# Patient Record
Sex: Female | Born: 1980
Health system: Northeastern US, Community
[De-identification: ages and names within clinical notes are randomized; demographics above are authoritative.]

## PROBLEM LIST (undated history)

## (undated) DIAGNOSIS — G40909 Epilepsy, unspecified, not intractable, without status epilepticus: Secondary | ICD-10-CM

## (undated) DIAGNOSIS — N6019 Diffuse cystic mastopathy of unspecified breast: Secondary | ICD-10-CM

## (undated) DIAGNOSIS — R569 Unspecified convulsions: Secondary | ICD-10-CM

## (undated) DIAGNOSIS — M1990 Unspecified osteoarthritis, unspecified site: Secondary | ICD-10-CM

## (undated) DIAGNOSIS — R51 Headache: Secondary | ICD-10-CM

## (undated) HISTORY — DX: Diffuse cystic mastopathy of unspecified breast: N60.19

## (undated) HISTORY — DX: Headache: R51

## (undated) HISTORY — PX: DILATION AND CURETTAGE OF UTERUS: SHX78

---

## 1999-04-21 ENCOUNTER — Encounter: Payer: Self-pay | Admitting: Emergency Medicine

## 1999-04-21 ENCOUNTER — Emergency Department (HOSPITAL_COMMUNITY): Admission: EM | Admit: 1999-04-21 | Discharge: 1999-04-21 | Payer: Self-pay | Admitting: Emergency Medicine

## 2000-03-21 ENCOUNTER — Emergency Department (HOSPITAL_COMMUNITY): Admission: EM | Admit: 2000-03-21 | Discharge: 2000-03-21 | Payer: Self-pay | Admitting: Emergency Medicine

## 2000-08-26 ENCOUNTER — Emergency Department (HOSPITAL_COMMUNITY): Admission: EM | Admit: 2000-08-26 | Discharge: 2000-08-26 | Payer: Self-pay | Admitting: Emergency Medicine

## 2001-02-06 ENCOUNTER — Emergency Department (HOSPITAL_COMMUNITY): Admission: EM | Admit: 2001-02-06 | Discharge: 2001-02-06 | Payer: Self-pay | Admitting: *Deleted

## 2001-02-10 ENCOUNTER — Emergency Department (HOSPITAL_COMMUNITY): Admission: EM | Admit: 2001-02-10 | Discharge: 2001-02-10 | Payer: Self-pay | Admitting: Emergency Medicine

## 2002-05-06 HISTORY — PX: LAPAROSCOPY: SHX197

## 2002-05-06 HISTORY — PX: HYSTEROSCOPY: SHX211

## 2002-08-20 ENCOUNTER — Encounter: Admission: RE | Admit: 2002-08-20 | Discharge: 2002-08-20 | Payer: Self-pay | Admitting: *Deleted

## 2002-08-20 ENCOUNTER — Encounter: Payer: Self-pay | Admitting: *Deleted

## 2002-09-02 ENCOUNTER — Other Ambulatory Visit: Admission: RE | Admit: 2002-09-02 | Discharge: 2002-09-02 | Payer: Self-pay | Admitting: *Deleted

## 2003-07-21 ENCOUNTER — Inpatient Hospital Stay (HOSPITAL_COMMUNITY): Admission: AD | Admit: 2003-07-21 | Discharge: 2003-07-21 | Payer: Self-pay | Admitting: Obstetrics and Gynecology

## 2003-08-23 ENCOUNTER — Encounter: Admission: RE | Admit: 2003-08-23 | Discharge: 2003-08-23 | Payer: Self-pay | Admitting: Obstetrics and Gynecology

## 2003-09-02 ENCOUNTER — Inpatient Hospital Stay (HOSPITAL_COMMUNITY): Admission: RE | Admit: 2003-09-02 | Discharge: 2003-09-06 | Payer: Self-pay | Admitting: Obstetrics and Gynecology

## 2003-09-02 ENCOUNTER — Encounter (INDEPENDENT_AMBULATORY_CARE_PROVIDER_SITE_OTHER): Payer: Self-pay | Admitting: Specialist

## 2003-09-09 ENCOUNTER — Inpatient Hospital Stay (HOSPITAL_COMMUNITY): Admission: AD | Admit: 2003-09-09 | Discharge: 2003-09-09 | Payer: Self-pay | Admitting: Obstetrics and Gynecology

## 2003-09-10 ENCOUNTER — Inpatient Hospital Stay (HOSPITAL_COMMUNITY): Admission: AD | Admit: 2003-09-10 | Discharge: 2003-09-10 | Payer: Self-pay | Admitting: Obstetrics and Gynecology

## 2003-09-12 ENCOUNTER — Inpatient Hospital Stay (HOSPITAL_COMMUNITY): Admission: AD | Admit: 2003-09-12 | Discharge: 2003-09-12 | Payer: Self-pay | Admitting: Obstetrics and Gynecology

## 2004-07-12 ENCOUNTER — Other Ambulatory Visit: Admission: RE | Admit: 2004-07-12 | Discharge: 2004-07-12 | Payer: Self-pay | Admitting: Obstetrics and Gynecology

## 2004-07-21 ENCOUNTER — Ambulatory Visit (HOSPITAL_COMMUNITY): Admission: AD | Admit: 2004-07-21 | Discharge: 2004-07-21 | Payer: Self-pay | Admitting: Obstetrics and Gynecology

## 2004-07-21 ENCOUNTER — Encounter (INDEPENDENT_AMBULATORY_CARE_PROVIDER_SITE_OTHER): Payer: Self-pay | Admitting: *Deleted

## 2005-09-23 ENCOUNTER — Other Ambulatory Visit: Admission: RE | Admit: 2005-09-23 | Discharge: 2005-09-23 | Payer: Self-pay | Admitting: Obstetrics and Gynecology

## 2005-11-25 ENCOUNTER — Inpatient Hospital Stay (HOSPITAL_COMMUNITY): Admission: AD | Admit: 2005-11-25 | Discharge: 2005-11-25 | Payer: Self-pay | Admitting: Obstetrics and Gynecology

## 2005-12-20 ENCOUNTER — Encounter (INDEPENDENT_AMBULATORY_CARE_PROVIDER_SITE_OTHER): Payer: Self-pay | Admitting: Specialist

## 2005-12-20 ENCOUNTER — Inpatient Hospital Stay (HOSPITAL_COMMUNITY): Admission: RE | Admit: 2005-12-20 | Discharge: 2005-12-23 | Payer: Self-pay | Admitting: Obstetrics and Gynecology

## 2005-12-27 ENCOUNTER — Inpatient Hospital Stay (HOSPITAL_COMMUNITY): Admission: AD | Admit: 2005-12-27 | Discharge: 2005-12-28 | Payer: Self-pay | Admitting: Obstetrics and Gynecology

## 2008-06-27 ENCOUNTER — Emergency Department (HOSPITAL_COMMUNITY): Admission: EM | Admit: 2008-06-27 | Discharge: 2008-06-27 | Payer: Self-pay | Admitting: Emergency Medicine

## 2009-04-25 ENCOUNTER — Emergency Department (HOSPITAL_COMMUNITY): Admission: EM | Admit: 2009-04-25 | Discharge: 2009-04-25 | Payer: Self-pay | Admitting: Emergency Medicine

## 2009-07-06 ENCOUNTER — Ambulatory Visit (HOSPITAL_COMMUNITY): Admission: RE | Admit: 2009-07-06 | Discharge: 2009-07-06 | Payer: Self-pay | Admitting: Obstetrics and Gynecology

## 2009-12-15 ENCOUNTER — Encounter: Admission: RE | Admit: 2009-12-15 | Discharge: 2009-12-15 | Payer: Self-pay | Admitting: Obstetrics and Gynecology

## 2010-01-22 ENCOUNTER — Ambulatory Visit (HOSPITAL_COMMUNITY): Admission: RE | Admit: 2010-01-22 | Discharge: 2010-01-22 | Payer: Self-pay | Admitting: Obstetrics and Gynecology

## 2010-02-01 ENCOUNTER — Ambulatory Visit (HOSPITAL_COMMUNITY): Admission: RE | Admit: 2010-02-01 | Discharge: 2010-02-01 | Payer: Self-pay | Admitting: Obstetrics and Gynecology

## 2010-07-19 LAB — CBC
HCT: 35.5 % — ABNORMAL LOW (ref 36.0–46.0)
Hemoglobin: 12.1 g/dL (ref 12.0–15.0)
MCH: 29.1 pg (ref 26.0–34.0)
MCHC: 34.3 g/dL (ref 30.0–36.0)
MCV: 85.2 fL (ref 78.0–100.0)
Platelets: 297 10*3/uL (ref 150–400)
Platelets: 311 10*3/uL (ref 150–400)
RBC: 4.15 MIL/uL (ref 3.87–5.11)
RBC: 4.17 MIL/uL (ref 3.87–5.11)
RDW: 12.8 % (ref 11.5–15.5)
WBC: 8.5 10*3/uL (ref 4.0–10.5)

## 2010-07-29 LAB — CBC
Hemoglobin: 12.5 g/dL (ref 12.0–15.0)
MCHC: 33.5 g/dL (ref 30.0–36.0)
RBC: 4.35 MIL/uL (ref 3.87–5.11)
WBC: 4.6 10*3/uL (ref 4.0–10.5)

## 2010-08-06 LAB — POCT RAPID STREP A (OFFICE): Streptococcus, Group A Screen (Direct): NEGATIVE

## 2010-09-21 NOTE — Op Note (Signed)
NAME:  Paige Dunn, Paige Dunn                 ACCOUNT NO.:  [id number]   MEDICAL RECORD NO.:  [id number]          PATIENT TYPE:  INP   LOCATION:  9107                          FACILITY:  [facility]   PHYSICIAN:  Naima A. Dillard, M.D. DATE OF BIRTH:  05-31-1980   DATE OF PROCEDURE:  12/21/2005  DATE OF DISCHARGE:                                 OPERATIVE REPORT   PREOPERATIVE DIAGNOSES:  1. Intrauterine pregnancy at term.  2. Gestational diabetes.  3. Transverse presentation.  4. For repeat cesarean section.   POSTOPERATIVE DIAGNOSES:  1. Intrauterine pregnancy at term.  2. Gestational diabetes.  3. Transverse presentation.  4. For repeat cesarean section.   PROCEDURE:  Repeat cesarean section and scar revision.   SURGEON:  Naima A. Normand Sloop, M.D.   ASSISTANT:  Elby Showers. Williams, C.N.M.   ANESTHESIA:  Spinal.   SPECIMENS:  Placenta sent to pathology.   ESTIMATED BLOOD LOSS:  700 mL.   IV FLUIDS:  3500 mL.   URINE OUTPUT:  800 mL.   COMPLICATIONS:  None.   FINDINGS:  A female infant born at 33, with a weight of 8 pounds 3 ounces.  Cord pH was 7.29.  Apgars of 3 and 9.  The patient to PACU in stable  condition.   PROCEDURE IN DETAIL:  The patient was taken to the operating room, where  spinal anesthesia was found to be adequate.  She was prepped and draped in  the normal sterile fashion and a Foley catheter was placed.  She was placed  in dorsal supine position with a left lateral tilt.  Once her anesthesia was  found to be adequate, a Pfannenstiel skin incision was made along her  previous incision with a scalpel, carried down to the fascia using Bovie  cautery.  The fascia was incised in the midline, extended bilaterally using  Bovie cautery.  Kochers x2 were placed in the superior aspect of the fascia,  which was dissected off the rectus muscles both sharply and bluntly.  The  inferior aspect of the fascia was dissected off the rectus muscle in a  similar fashion.  Rectus  muscles were separated in the midline and the  peritoneum was identified, tented up and entered sharply.  The bladder blade  was inserted, the vesicouterine peritoneum was identified, tented up and  entered sharply.  A lower transverse incision was made and extended  bilaterally bluntly.  Clear fluid was noted.  The baby was noted to be  transverse, back up and feet down.  I tried to vert the head down to the  pelvis.  That did not work, so then I found both feet and we used breech  maneuvers.  The head was difficult to deliver but after flexing the chin and  flexion and fundal pressure, we were able to deliver the head.  There was a  nuchal x1.  The cord was clamped and cut.  The baby was handed over to the  awaiting pediatricians.  Cord gases were obtained and the placenta was  delivered and sent to pathology.  On inspection of the  uterus, there was no  septum.  The uterus was normal shape and size.  There were no abnormalities.  She had normal tubes and ovaries and normal abdominal anatomy.  The uterus  was cleared of all clot and debris.  The uterus was repaired with 0 Vicryl  on a running locked fashion.  The second layer of 0 Vicryl was used to  imbricate the incision.  Hemostasis was assured.  Irrigation was done.  The  peritoneum was closed with 0 chromic in a running fashion.  The fascia was  closed with 0 Vicryl in a running fashion. The subcutaneous tissue was made  hemostatic using Bovie cautery.  A JP drain was placed in the subcutaneous  space.  The patient's incision had previously opened after her last cesarean  section, so on the left side of the incision the scar was a little bit  hypertrophic and difficult to sew, so part of the scar was cut away and the  incision was then reapproximated using 3-0 Monocryl in a subcuticular  fashion.  Sponge, lap and needle counts were correct.  Patient to recovery  room in stable condition.      Naima A. Normand Sloop, M.D.   Electronically Signed     NAD/MEDQ  D:  12/20/2005  T:  12/21/2005  Job:  [id number]

## 2010-09-21 NOTE — Discharge Summary (Signed)
NAME:  Paige Dunn, Paige Dunn                           ACCOUNT NO.:  [id number]   MEDICAL RECORD NO.:  [id number]                   PATIENT TYPE:  INP   LOCATION:  9125                                 [facility]ACILITY:  [facility]   PHYSICIAN:  Paige A. Dillard, M.D.              DATE OF BIRTH:  07-Jan-1981   DATE OF ADMISSION:  09/02/2003  DATE OF DISCHARGE:  09/06/2003                                 DISCHARGE SUMMARY   ADMISSION DIAGNOSES:  1. Intrauterine pregnancy at term.  2. Breech presentation.  3. Desires cesarean section.   DISCHARGE DIAGNOSES:  1. Intrauterine pregnancy at term.  2. Breech presentation.  3. Desires cesarean section.  4. Status post cesarean section delivery of a female infant in frank breech     presentation, Apgar's were 6 and 8, weight 8 pounds, 4 ounces.  5. Arcuate uterus.   PROCEDURES:  1. Spinal anesthesia.  2. Primary low transverse cesarean section.   HOSPITAL COURSE:  The patient was admitted for an elective primary low  transverse cesarean section for breech presentation secondary to patient's  request.  She was delivered of a female infant in frank breech presentation  with Apgar's of 6 and 8.  The baby was taken to NICU for low blood sugar.  The patient was taken to postpartum for normal postoperative care later that  same day.  The patient was complaining of her legs still being numb and she  was evaluated by Dr. Quillian Quince who found no abnormalities.  On  postoperative day number 2 the baby was transferred to full term nursery and  the patient was doing well. She was breast feeding.  On postoperative day  number 3 she was doing well, she elected to stay and extra day because of  need for increased time with breast feeding during hospitalization.  Her  capillary blood glucose was monitored and found to be 91.  On postoperative  day number 4 she was ready for discharge.  She was experiencing a new  outbreak of HSV-1.  Vital signs were stable, chest clear,  heart - regular  rate and rhythm.  Abdomen was soft and appropriately tender, incision clean, dry and intact.  Lochia was small.  Extremities within normal limits.  She was deemed to have  received full benefit of her hospital stay and was discharged home.   DISCHARGE MEDICATIONS:  1. Motrin 600 mg p.o. q.6h p.r.n.  2. Tylox 1-2 p.o. q.4h p.r.n.  3. Valtrex 500 mg p.o. q.12h x5 days.   DISCHARGE LABS:  White blood count 9.6 thousand, hemoglobin 11.2, platelets  235,000.  RPR nonreactive.  Rubella 5.2.   DISCHARGE INSTRUCTIONS:  Per [facility] hand-out.   FOLLOW UP:  In 6 weeks or p.r.n.   CONDITION ON DISCHARGE:  Stable and well.     Marie L. Williams, C.N.M.  Paige Dunn, M.D.    MLW/MEDQ  D:  09/06/2003  T:  09/07/2003  Job:  [id number]

## 2010-09-21 NOTE — Discharge Summary (Signed)
NAME:  Paige Dunn, Paige Dunn NO.:  [id number]   MEDICAL RECORD NO.:  [id number]          PATIENT TYPE:  INP   LOCATION:  9107                          [facility]   PHYSICIAN:  Crist Fat. Rivard, M.D. DATE OF BIRTH:  1981-04-18   DATE OF ADMISSION:  12/20/2005  DATE OF DISCHARGE:  12/23/2005                                 DISCHARGE SUMMARY   ADMITTING DIAGNOSES:  1. Intrauterine pregnancy at term. Desires repeat cesarean section.  2. Gestational diabetes.   PROCEDURE:  Repeat low transverse cesarean section.   DISCHARGE DIAGNOSIS:  1. Intrauterine pregnancy at term, delivered.  2. Repeat low transverse cesarean section.  3. Gestational diabetes.   [name] is a 30 year old gravida 3, para 1-0-1-1 who presented at 30-3/7  weeks for repeat cesarean section. This was accomplished on December 20, 2005  with Dr. Jaymes Graff as surgeon. The patient delivered an 8 pound 3 ounce  female infant with Apgar scores of 3 at 1 minute and 9 at 5 minutes.  Both  mother and infant have done well in the immediate postoperative period. The  patient's hemoglobin on the first postpartum day was 11.3. Her vital signs  remained stable. She is afebrile.  Her fasting blood sugars have remained  high. They were 122 on the first day and 134 on the second postoperative  day. Dr. Normand Sloop has recommended that patient continue to check her fasting  blood sugars x1 week and report them to the office. Her incision is clean,  dry, and intact on this her third postoperative day. Her JP drain was  removed easily with minimal drainage. The patient is judged to be in  satisfactory condition for discharge. Discharge instructions are provided  per [facility] handout.   DISCHARGE MEDICATIONS:  1. Motrin 600 mg p.o. q.6 h. p.r.n. pain.  2. Tylox 1-2 p.o. q.3-4 h. p.r.n. pain.  3. Micronor. The patient can start in 2 weeks if desired for      contraception.  4. Prenatal vitamins.   DISCHARGE FOLLOWUP:  Discharge follow-up will be at the office of [facility] in  six weeks.     [name], C.N.M.      Crist Fat Rivard, M.D.  Electronically Signed   SDM/MEDQ  D:  12/23/2005  T:  12/23/2005  Job:  [id number]

## 2010-09-21 NOTE — Op Note (Signed)
NAME:  Paige Dunn, Paige Dunn                           ACCOUNT NO.:  [id number]   MEDICAL RECORD NO.:  [id number]                   PATIENT TYPE:  INP   LOCATION:  9198                                 [facility]   PHYSICIAN:  Naima A. Dillard, M.D.              DATE OF BIRTH:  10/08/1980   DATE OF PROCEDURE:  09/02/2003  DATE OF DISCHARGE:                                 OPERATIVE REPORT   PREOPERATIVE DIAGNOSES:  Breech at term, desires cesarean section.   POSTOPERATIVE DIAGNOSES:  Breech at term, desires cesarean section.   PROCEDURE:  Primary low transverse cesarean section.   ANESTHESIA:  Spinal.   SURGEON:  Naima A. Normand Sloop, M.D.   ASSISTANT:  Concha Pyo. Duplantis, C.N.M.   ESTIMATED BLOOD LOSS:  600 mL   IV FLUIDS:  3600 mL crystalloid.   URINE OUTPUT:  700 mL clear urine.   FINDINGS:  A female infant in frank breech presentation with clear fluid,  normal appearing tubes and ovaries bilaterally. Patient with an arcuate  uterus, normal abdominal anatomy.   DESCRIPTION OF PROCEDURE:  The patient was taken to the operating room where  she was given spinal anesthesia, placed in dorsal supine position with a  left lateral tilt and prepped and draped in the normal sterile fashion. A  Pfannenstiel skin incision was then made with the scalpel and carried down  to the fascia using Bovie cautery. The fascia was incised to the midline,  extended bilaterally using Bovie cautery.  Kocher's x2 are placed in the  superior aspect of the fascia which was dissected off the rectus muscles  both sharply and bluntly.  Hemostasis was noted.  The inferior aspect of the  fascia was dissected off the rectus muscle in a similar fashion.  The rectus  muscle was separated in the midline, peritoneum was identified, tented up  and entered sharply and extended bluntly.  The vesicouterine peritoneum was  identified, tented up and entered sharply with the Metzenbaum scissors,  extended bilaterally.   Using Metzenbaum scissors, the bladder flap was  created, bladder blade was inserted.  Primary lower transverse uterine  incision was then made with a scalpel and extended bluntly.  The infant was  delivered using breech maneuvers, both feet were grabbed and infant was  delivered without difficulty and handed over to the pediatrician. Cord gas  and cord blood were both obtained, placenta was delivered manually and noted  to be a little __________ in part of the cord.  The uterus was cleared of  all clot and debris, uterine incision was repaired with #0 Vicryl in a  running locked fashion. A second layer of #0 Vicryl was used for  embrocation. The abdomen was irrigated copiously, the findings noted above  were seen.  Hemostasis was noted. The peritoneum was closed with #0  chromic. The fascia was closed with #0 Vicryl in a running fashion. The  subcutaneous tissue was irrigated, noted to be hemostatic, the skin was  closed with staples.  Sponge, lap and needle counts were correct x2.  Cord  pH was 7.31.  The patient went to the recovery room in stable condition.                                               Naima A. Normand Sloop, M.D.    NAD/MEDQ  D:  09/02/2003  T:  09/02/2003  Job:  [id number]

## 2010-09-21 NOTE — H&P (Signed)
NAME:  Paige Dunn, Paige Dunn                           ACCOUNT NO.:  [id number]   MEDICAL RECORD NO.:  [id number]                   PATIENT TYPE:  INP   LOCATION:  NA                                   [facility]   PHYSICIAN:  Naima A. Dillard, M.D.              DATE OF BIRTH:  [birth year]   DATE OF ADMISSION:  DATE OF DISCHARGE:                                HISTORY & PHYSICAL   CHIEF COMPLAINT:  A 39-week breech presentation, desires Cesarean section.   HISTORY OF PRESENT ILLNESS:  The patient is a 30 year old African American  female, gravida 1, para 0, whose last menstrual period was December 01, 2002,  estimated date of confinement of Sep 07, 2003, consistent with a 17 week  ultrasound, presenting at 37-2/7 weeks for Cesarean section secondary to  breech presentation. The patient had an ultrasound at 36 weeks that  demonstrated the baby to be between 50th and 75th percentile weighing 6  pounds 8 ounces, normal AFI at 10.6, and frank breech presentation. The  patient was given the option of external cephalic version versus Cesarean  section. The risks and benefits of each were reviewed and the patient  decided to proceed with Cesarean section. Pregnancy has been complicated by  gestational diabetes diagnosed at 37 weeks secondary to glucosuria.  The  patient's blood sugars fasting have been from 89 to 119, two hour  postprandial has been 81 to as high as 231. The patient has had NSTs which  have been reactive twice a week. Due to the patient's breech presentation,  she will have a Cesarean section. She is rubella nonimmune, varicella  nonimmune, and will be immune postpartum. Also, group strep positive.   PAST OBSTETRIC HISTORY:  Unremarkable.   PAST GYNECOLOGIC HISTORY:  No history of any sexually transmitted diseases,  menarche at 6 or 14 occurring every 30 days lasting for six to eight days.  The patient denies any history of abnormal Pap smear.   PAST SURGICAL HISTORY:  Significant  for a D&C, hysteroscopy, and laparoscopy  for treatment of endometriosis in July 2004.   The patient is married, has a supportive husband. Denies any alcohol, drug,  or illicit drug use.   PAST MEDICAL HISTORY:  As above.   ALLERGIES AND DRUG SENSITIVITIES:  ASPIRIN and IBUPROFEN; the patient says  it gives her the shakes.   MEDICATIONS:  Prenatal vitamins.   PRENATAL LABORATORY:  O positive, antibody negative. RPR nonreactive.  Rubella nonimmune. Varicella nonimmune. Hepatitis B surface antigen is  negative. HIV is deferred. Quad screen is deferred. Platelets 318,000,  hemoglobin 10. Sickle trait negative. One-hour Glucola at 28 weeks was 113.  Pap test within normal limits in April 2004.  GBS positive.   FAMILY HISTORY:  Father has history of myocardial infarction. Mother has  history of high blood pressure. Maternal grandmother with history of insulin-  dependent diabetes.   PHYSICAL  EXAMINATION:  VITAL SIGNS: The patient weighed 181 pounds. Fetal  heart tones 150, blood pressure 116/72. Urine revealed trace protein,  negative sugar.  SKIN: Within normal limits.  NEUROLOGIC: Within normal limits.  EXTREMITIES: No clubbing, cyanosis, or edema.  HEENT: Head was normocephalic and atraumatic. Mouth revealed normal buccal  mucosa. Nares clear. The patient had good dentition.  NECK: The patient had no thyromegaly.  BREASTS: no nipple retraction. No abnormal masses or axillary masses. No  nipple discharge bilaterally.  HEART: Regular rate and rhythm.  LUNGS: Clear to auscultation bilaterally.  ABDOMEN: Gravid, soft, and nontender, measuring 38 cm.  EXTREMITIES: No clubbing or cyanosis, but trace edema.  PELVIC EXAM: Last cervical exam revealed the cervix long and closed.   ASSESSMENT:  Breech presentation at 39 weeks, desirous of Cesarean section.   The patient understands the risks not limited to bleeding, infection, damage  to internal organs such as bowel, bladder, or major  blood vessels, need for  another Cesarean section in the future, possibilities of placenta previa,  accreta/increta/percreta, uterine rupture with subsequent pregnancies, and  even hysterectomy. The patient agrees to proceed with plan of care.                                               Naima A. Normand Sloop, M.D.    NAD/MEDQ  D:  08/29/2003  T:  08/30/2003  Job:  [id number]

## 2010-09-21 NOTE — Op Note (Signed)
NAME:  Paige Dunn, Paige Dunn NO.:  [id number]   MEDICAL RECORD NO.:  [id number]          PATIENT TYPE:  AMB   LOCATION:  SDC                           [facility]   PHYSICIAN:  Hal Morales, M.D.DATE OF BIRTH:  30-Jul-1980   DATE OF PROCEDURE:  DATE OF DISCHARGE:                                 OPERATIVE REPORT   PREOPERATIVE DIAGNOSIS:  Anembryonic pregnancy and incomplete abortion.   POSTOPERATIVE DIAGNOSIS:  Anembryonic pregnancy and incomplete abortion.   OPERATION:  Suction dilatation and evacuation.   SURGEON:  Dr. Dierdre Forth.   ANESTHESIA:  Monitored anesthesia care and local.   ESTIMATED BLOOD LOSS:  Less than 25 mL.   COMPLICATIONS:  None.   FINDINGS:  The uterus was enlarged to approximately 8-week size.  There was  a large amount of products of conception obtained at time of evacuation.   SPECIMENS TO PATHOLOGY:  Products of conception.   BLOOD TYPE:  O positive.   PREOPERATIVE DISCUSSION:  A discussion was held with the patient concerning  the finding on ultrasound of anembryonic pregnancy. The options of  observation, Cytotec, and dilatation and evacuation were presented to the  patient and she opted for dilatation and evacuation. The risks of  anesthesia, bleeding, infection, damage to adjacent organs, and uterine  perforation were all explained to her and she wished to proceed.   PROCEDURE IN DETAIL:  The patient was taken to the operating room after  appropriate identification and placed on the operating table. After  placement of equipment for monitored anesthesia care, She was placed in the  lithotomy position. The perineum and vagina were prepped with multiple  layers of Betadine and draped as a sterile field. A red Robinson catheter  was used to empty the bladder. A Graves speculum was placed in the vagina  and a single-tooth tenaculum placed on the anterior cervix. A paracervical  block was achieved with a total of 10 mL of  2% Xylocaine and the 5 and 7  o'clock positions. The cervix was dilated to accommodate a #8 suction  curette and this was used to suction evacuate all quadrants of the uterus.  Once all products of conception had been removed, all instruments were  removed from the vagina and the patient taken from the operating room to the  recovery room in satisfactory condition having tolerated the procedure well  with sponge and instrument counts correct.   DISCHARGE INSTRUCTIONS:  Printed instructions from the [facility] for  D&E.   DISCHARGE MEDICATIONS:  1.  Methergine 0.2 mg p.o. q.6h for eight doses.  2.  Doxycycline 100 mg p.o. b.i.d. times 5 days.  3.  Ibuprofen over-the-counter 600 mg p.o. q.6h p.r.n. pain.   FOLLOW-UP:  The patient is to call for 2-week follow-up with Dr. Pennie Rushing.      VPH/MEDQ  D:  07/21/2004  T:  07/22/2004  Job:  [id number]

## 2010-09-21 NOTE — H&P (Signed)
NAME:  Paige Dunn, Paige Dunn                 ACCOUNT NO.:  [id number]   MEDICAL RECORD NO.:  [id number]          PATIENT TYPE:  INP   LOCATION:  NA                            [facility]   PHYSICIAN:  Naima A. Dillard, M.D. DATE OF BIRTH:  08/13/1980   DATE OF ADMISSION:  DATE OF DISCHARGE:                                HISTORY & PHYSICAL   The patient is a 30 year old gravida 3, para 1-0-1-1, who is a 38 weeks 3  days with a transverse lie.  The patient is for repeat cesarean section.   HISTORY AND PHYSICAL:  The patient presented for her prenatal care at 11  weeks and has had the following issues:   1. She had hip pain, which was corrected with ibuprofen and Flexeril.  2. She developed gestational diabetes just at 38 weeks.  Her 18-week      Glucola and 28-week Glucola were normal.  She came in for the 2-hour      postprandial on August 14 with 203 and on August 15 with a fasting      blood sugar of 117.  3. Also the patient has a history of previous cesarean section for breech      but and she desired to VBAC, but because the patient has an abnormal      lie we will proceed with repeat cesarean section.   PERTINENT LABS:  Last hemoglobin was 12, last platelets were 325.  She is O  positive, antibody negative.  Sickle cell is negative.  She is rubella-  immune.  RPR was nonreactive.  GC, chlamydia and GBS were negative.  Patient  deferred MSAFP.  Hepatitis B surface antigen was negative.   PAST OBSTETRICAL HISTORY:  Significant for in April 2005 the patient had a  little boy weighing 8 pounds 4 ounces, and she had a primary low transverse  cesarean section for breech presentation.  In March 2006 she had a D&E for  an anembryonic pregnancy.  A pregnancy is at present.   PAST GYNECOLOGIC HISTORY:  Significant for anembryonic pregnancy.  In July  2004 she had a laparoscopy for endometriosis.  Denies any history of  sexually transmitted diseases or abnormal Pap smear.   PAST MEDICAL  HISTORY:  As above.   The patient has no known drug allergies.   FAMILY HISTORY:  Significant for father with history of a myocardial  infarction and maternal grandmother with chronic hypertension, and the  father of the baby has a nice with Down syndrome.   PHYSICAL EXAMINATION:  VITAL SIGNS:  The patient weighs 195 pounds.  Blood  pressure is 110/64.  Fundal height is 38.  She is 5 feet 10 inches.  SKIN:  Normal.  NEUROLOGIC:  Normal.  EXTREMITIES:  No cyanosis or clubbing but trace edema.  HEENT:  Normal.  NECK:  Thyroid is not enlarged.  BREASTS:  No nipple retraction or abnormal masses.  CARDIAC:  Regular rate and rhythm.  LUNGS:  Clear to auscultation bilaterally.  ABDOMEN:  Gravid, soft and nontender.  PELVIC:  Vulvovaginal exam is within normal  limits.  Cervix is 1, long and  high.   At her last ultrasound the baby was transverse, weighing 7 pounds 15 ounces  with AFI of 19.4.   ASSESSMENT:  1. Pregnancy at term, desires repeat cesarean section.  2. Late-onset gestational diabetes.   PLAN:  Repeat cesarean section.  The patient understands the risks are but  not limited to bleeding, infection, damage to internal organs such as bowel,  bladder and major blood vessels, and also abnormal presentation in future  pregnancies.  The patient was also offered a version.  Risks and benefits  were reviewed.  The patient has desired to go ahead with repeat cesarean  section.      Naima A. Normand Sloop, M.D.  Electronically Signed     NAD/MEDQ  D:  12/19/2005  T:  12/19/2005  Job:  [id number]

## 2010-10-12 ENCOUNTER — Emergency Department (HOSPITAL_COMMUNITY)
Admission: EM | Admit: 2010-10-12 | Discharge: 2010-10-13 | Disposition: A | Discharge status: Home / Self Care | Payer: BC Managed Care – PPO | Attending: Emergency Medicine | Admitting: Emergency Medicine

## 2010-10-12 DIAGNOSIS — X58XXXA Exposure to other specified factors, initial encounter: Secondary | ICD-10-CM | POA: Insufficient documentation

## 2010-10-12 DIAGNOSIS — R079 Chest pain, unspecified: Secondary | ICD-10-CM | POA: Insufficient documentation

## 2010-10-12 DIAGNOSIS — T7840XA Allergy, unspecified, initial encounter: Secondary | ICD-10-CM | POA: Insufficient documentation

## 2010-10-12 DIAGNOSIS — L02419 Cutaneous abscess of limb, unspecified: Secondary | ICD-10-CM | POA: Insufficient documentation

## 2010-10-31 ENCOUNTER — Emergency Department (HOSPITAL_COMMUNITY)
Admission: EM | Admit: 2010-10-31 | Discharge: 2010-10-31 | Disposition: A | Discharge status: Home / Self Care | Payer: BC Managed Care – PPO | Attending: Emergency Medicine | Admitting: Emergency Medicine

## 2010-10-31 DIAGNOSIS — L02419 Cutaneous abscess of limb, unspecified: Secondary | ICD-10-CM | POA: Insufficient documentation

## 2010-11-01 ENCOUNTER — Other Ambulatory Visit: Payer: Self-pay | Admitting: Internal Medicine

## 2010-11-01 DIAGNOSIS — M79669 Pain in unspecified lower leg: Secondary | ICD-10-CM

## 2010-11-02 ENCOUNTER — Ambulatory Visit
Admission: RE | Admit: 2010-11-02 | Discharge: 2010-11-02 | Disposition: A | Discharge status: Home / Self Care | Payer: BC Managed Care – PPO | Source: Ambulatory Visit | Attending: Internal Medicine | Admitting: Internal Medicine

## 2010-11-02 DIAGNOSIS — M79669 Pain in unspecified lower leg: Secondary | ICD-10-CM

## 2011-07-23 ENCOUNTER — Ambulatory Visit: Payer: Self-pay | Admitting: Obstetrics and Gynecology

## 2011-07-27 ENCOUNTER — Emergency Department (INDEPENDENT_AMBULATORY_CARE_PROVIDER_SITE_OTHER): Payer: BC Managed Care – PPO

## 2011-07-27 ENCOUNTER — Encounter (HOSPITAL_BASED_OUTPATIENT_CLINIC_OR_DEPARTMENT_OTHER): Payer: Self-pay | Admitting: *Deleted

## 2011-07-27 ENCOUNTER — Emergency Department (HOSPITAL_BASED_OUTPATIENT_CLINIC_OR_DEPARTMENT_OTHER)
Admission: EM | Admit: 2011-07-27 | Discharge: 2011-07-27 | Disposition: A | Discharge status: Home / Self Care | Payer: BC Managed Care – PPO | Attending: Emergency Medicine | Admitting: Emergency Medicine

## 2011-07-27 DIAGNOSIS — R059 Cough, unspecified: Secondary | ICD-10-CM | POA: Insufficient documentation

## 2011-07-27 DIAGNOSIS — R0989 Other specified symptoms and signs involving the circulatory and respiratory systems: Secondary | ICD-10-CM

## 2011-07-27 DIAGNOSIS — J029 Acute pharyngitis, unspecified: Secondary | ICD-10-CM

## 2011-07-27 DIAGNOSIS — J069 Acute upper respiratory infection, unspecified: Secondary | ICD-10-CM

## 2011-07-27 DIAGNOSIS — R05 Cough: Secondary | ICD-10-CM | POA: Insufficient documentation

## 2011-07-27 DIAGNOSIS — Z886 Allergy status to analgesic agent status: Secondary | ICD-10-CM | POA: Insufficient documentation

## 2011-07-27 DIAGNOSIS — H9209 Otalgia, unspecified ear: Secondary | ICD-10-CM | POA: Insufficient documentation

## 2011-07-27 NOTE — Discharge Instructions (Signed)
Antibiotic Nonuse  Your caregiver felt that the infection or problem was not one that would be helped with an antibiotic. Infections may be caused by viruses or bacteria. Only a caregiver can tell which one of these is the likely cause of an illness. A cold is the most common cause of infection in both adults and children. A cold is a virus. Antibiotic treatment will have no effect on a viral infection. Viruses can lead to many lost days of work caring for sick children and many missed days of school. Children may catch as many as 10 "colds" or "flus" per year during which they can be tearful, cranky, and uncomfortable. The goal of treating a virus is aimed at keeping the ill person comfortable. Antibiotics are medications used to help the body fight bacterial infections. There are relatively few types of bacteria that cause infections but there are hundreds of viruses. While both viruses and bacteria cause infection they are very different types of germs. A viral infection will typically go away by itself within 7 to 10 days. Bacterial infections may spread or get worse without antibiotic treatment. Examples of bacterial infections are:  Sore throats (like strep throat or tonsillitis).   Infection in the lung (pneumonia).   Ear and skin infections.  Examples of viral infections are:  Colds or flus.   Most coughs and bronchitis.   Sore throats not caused by Strep.   Runny noses.  It is often best not to take an antibiotic when a viral infection is the cause of the problem. Antibiotics can kill off the helpful bacteria that we have inside our body and allow harmful bacteria to start growing. Antibiotics can cause side effects such as allergies, nausea, and diarrhea without helping to improve the symptoms of the viral infection. Additionally, repeated uses of antibiotics can cause bacteria inside of our body to become resistant. That resistance can be passed onto harmful bacterial. The next time  you have an infection it may be harder to treat if antibiotics are used when they are not needed. Not treating with antibiotics allows our own immune system to develop and take care of infections more efficiently. Also, antibiotics will work better for us when they are prescribed for bacterial infections. Treatments for a child that is ill may include:  Give extra fluids throughout the day to stay hydrated.   Get plenty of rest.   Only give your child over-the-counter or prescription medicines for pain, discomfort, or fever as directed by your caregiver.   The use of a cool mist humidifier may help stuffy noses.   Cold medications if suggested by your caregiver.  Your caregiver may decide to start you on an antibiotic if:  The problem you were seen for today continues for a longer length of time than expected.   You develop a secondary bacterial infection.  SEEK MEDICAL CARE IF:  Fever lasts longer than 5 days.   Symptoms continue to get worse after 5 to 7 days or become severe.   Difficulty in breathing develops.   Signs of dehydration develop (poor drinking, rare urinating, dark colored urine).   Changes in behavior or worsening tiredness (listlessness or lethargy).  Document Released: 07/01/2001 Document Revised: 04/11/2011 Document Reviewed: 12/28/2008 ExitCare Patient Information 2012 [employer].Viral Syndrome You or your child has Viral Syndrome. It is the most common infection causing "colds" and infections in the nose, throat, sinuses, and breathing tubes. Sometimes the infection causes nausea, vomiting, or diarrhea. The germ that   causes the infection is a virus. No antibiotic or other medicine will kill it. There are medicines that you can take to make you or your child more comfortable.  HOME CARE INSTRUCTIONS   Rest in bed until you start to feel better.   If you have diarrhea or vomiting, eat small amounts of crackers and toast. Soup is helpful.   Do not give  aspirin or medicine that contains aspirin to children.   Only take over-the-counter or prescription medicines for pain, discomfort, or fever as directed by your caregiver.  SEEK IMMEDIATE MEDICAL CARE IF:   You or your child has not improved within one week.   You or your child has pain that is not at least partially relieved by over-the-counter medicine.   Thick, colored mucus or blood is coughed up.   Discharge from the nose becomes thick yellow or green.   Diarrhea or vomiting gets worse.   There is any major change in your or your child's condition.   You or your child develops a skin rash, stiff neck, severe headache, or are unable to hold down food or fluid.   You or your child has an oral temperature above 102 F (38.9 C), not controlled by medicine.   Your baby is older than 3 months with a rectal temperature of 102 F (38.9 C) or higher.   Your baby is 3 months old or younger with a rectal temperature of 100.4 F (38 C) or higher.  Document Released: 04/07/2006 Document Revised: 04/11/2011 Document Reviewed: 04/08/2007 ExitCare Patient Information 2012 [employer]. 

## 2011-07-27 NOTE — ED Notes (Signed)
Pt presents to ED today with flu like sx for the last 3 days.  Pt has been taking otc " tylenol flu" with minimal relief in sx

## 2011-07-27 NOTE — ED Provider Notes (Signed)
History  This chart was scribed for Toy Baker, MD by Bennett Scrape. This patient was seen in room MH12/MH12 and the patient's care was started at 8:58PM.  CSN: [id number]  Arrival date & time 07/27/11  2026   First MD Initiated Contact with Patient 07/27/11 2048      Chief Complaint  Patient presents with  . Influenza    The history is provided by the patient. No language interpreter was used.     Paige Dunn is a 31 y.o. female who presents to the Emergency Department complaining of 3 days of gradual onset, gradually worsening, constant flu-like symptoms including productive cough, sore throat, congestion and left ear pain. She denies any modifying factors. She reports taking over the counter tylenol serve cold and flu medication with mild relief in symptoms. 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 She denies fever, emesis and diarrhea and urinary symptoms. She has no h/o chronic medical conditions. She denies smoking and alcohol use.   History reviewed. No pertinent past medical history.  History reviewed. No pertinent past surgical history.  No family history on file.  History  Substance Use Topics  . Smoking status: Never Smoker   . Smokeless tobacco: Not on file  . Alcohol Use: No    Review of Systems  Constitutional: Negative for fever and chills.  HENT: Positive for ear pain (Left), congestion and sore throat. Negative for neck pain.   Eyes: Negative for pain.  Respiratory: Positive for cough. Negative for shortness of breath.   Cardiovascular: Negative for chest pain.  Gastrointestinal: Negative for nausea, vomiting, abdominal pain and diarrhea.  Genitourinary: Negative for dysuria, urgency and hematuria.  Musculoskeletal: Negative for back pain.  Skin: Negative for rash.  Neurological: Negative for seizures and headaches.  Psychiatric/Behavioral: Negative for confusion.    Allergies   Aspirin  Home Medications   Current Outpatient Rx  Name Route Sig Dispense Refill  . ETONOGESTREL-ETHINYL ESTRADIOL 0.12-0.015 MG/24HR VA RING Vaginal Place 1 each vaginally every 28 (twenty-eight) days. Insert vaginally and leave in place for 3 consecutive weeks, then remove for 1 week.      Triage Vitals: BP 133/85  Pulse 85  Temp(Src) 98.2 F (36.8 C) (Oral)  Resp 20  SpO2 99%  Physical Exam  Nursing note and vitals reviewed. Constitutional: She is oriented to person, place, and time. She appears well-developed and well-nourished.  HENT:  Head: Normocephalic and atraumatic.  Mouth/Throat: Oropharynx is clear and moist.       Right and left TMs are clear bilaterally  Eyes: Conjunctivae and EOM are normal.  Neck: Normal range of motion. Neck supple.  Cardiovascular: Normal rate, regular rhythm and normal heart sounds.   Pulmonary/Chest: Effort normal and breath sounds normal.  Abdominal: Soft. There is no tenderness.  Musculoskeletal: Normal range of motion. She exhibits no edema.  Neurological: She is alert and oriented to person, place, and time. No cranial nerve deficit.  Skin: Skin is warm and dry.  Psychiatric: She has a normal mood and affect. Her behavior is normal.    ED Course  Procedures (including critical care time)  DIAGNOSTIC STUDIES: Oxygen Saturation is 99% on room air, normal by my interpretation.    COORDINATION OF CARE: 9:00Pm-Discussed chest x-ray order with pt and pt agreed to order. Advised pt to use mucinex upon discharge.    Labs Reviewed - No data to display No results found.   No  diagnosis found.    MDM  Patient's chest x-ray negative. Suspect that patient has a viral illness. Patient to be discharged home     I personally performed the services described in this documentation, which was scribed in my presence. The recorded information has been reviewed and considered. \   Toy Baker, MD 07/27/11 2148

## 2011-07-27 NOTE — ED Notes (Signed)
D/c home- no new rx given 

## 2011-08-06 ENCOUNTER — Encounter (INDEPENDENT_AMBULATORY_CARE_PROVIDER_SITE_OTHER): Payer: BC Managed Care – PPO | Admitting: Registered Nurse

## 2011-08-06 DIAGNOSIS — O4690 Antepartum hemorrhage, unspecified, unspecified trimester: Secondary | ICD-10-CM

## 2011-08-11 ENCOUNTER — Encounter (HOSPITAL_COMMUNITY): Payer: Self-pay | Admitting: *Deleted

## 2011-08-11 ENCOUNTER — Inpatient Hospital Stay (HOSPITAL_COMMUNITY): Payer: BC Managed Care – PPO

## 2011-08-11 ENCOUNTER — Inpatient Hospital Stay (HOSPITAL_COMMUNITY)
Admission: AD | Admit: 2011-08-11 | Discharge: 2011-08-12 | Disposition: A | Discharge status: Home / Self Care | Payer: BC Managed Care – PPO | Source: Ambulatory Visit | Attending: Obstetrics and Gynecology | Admitting: Obstetrics and Gynecology

## 2011-08-11 DIAGNOSIS — O200 Threatened abortion: Secondary | ICD-10-CM

## 2011-08-11 DIAGNOSIS — IMO0002 Reserved for concepts with insufficient information to code with codable children: Secondary | ICD-10-CM

## 2011-08-11 DIAGNOSIS — O209 Hemorrhage in early pregnancy, unspecified: Secondary | ICD-10-CM | POA: Insufficient documentation

## 2011-08-11 LAB — URINALYSIS, ROUTINE W REFLEX MICROSCOPIC
Glucose, UA: NEGATIVE mg/dL
Leukocytes, UA: NEGATIVE
Nitrite: NEGATIVE
Specific Gravity, Urine: 1.015 (ref 1.005–1.030)
pH: 7.5 (ref 5.0–8.0)

## 2011-08-11 NOTE — MAU Note (Signed)
Pt states she has had intermittent pink bleeding for one week. Different now in that bleeding is more frequent, darker in color, and she has pressure mid upper abdomen.

## 2011-08-11 NOTE — MAU Note (Signed)
Pt states her vaginal bleeding has gone from a little spotting to dark red that varies to just spotting to a little bit on the pad-states she also feels pressure at the top of her abdomen that is not pain but just pressure that comes and goes

## 2011-08-11 NOTE — MAU Provider Note (Signed)
History     CSN: [id number]  Arrival date & time 08/11/11  2058   None     Chief Complaint  Patient presents with  . Vaginal Bleeding   Private patient of [facility]. Asked to evaluate patient by Corrie Dandy, CNM because she is in L&D and does not want a delay in this patient's evaluation.  HPI ZYANYA GLAZA is a 31 y.o. female @ [redacted] weeks gestation who presents to MAU for vaginal bleeding. States she started spotting 5 days ago and went to the office and had an ultrasound that showed a 5 + weeks gestation with heart beat of 103. Given an appointment to return this week for follow up ultrasound for continued growth and to recheck heart rate. Began bleeding again 3 days ago but only spotting, tonight increased bleeding and lower abdominal cramping. Denies nausea, vomiting or other problems. Patient states she is O positive.  The history was provided by the patient.  Past Medical History  Diagnosis Date  . Cesarean delivery delivered     No past surgical history on file.  No family history on file.  History  Substance Use Topics  . Smoking status: Never Smoker   . Smokeless tobacco: Not on file  . Alcohol Use: No    OB History    Grav Para Term Preterm Abortions TAB SAB Ect Mult Living                  Review of Systems  Constitutional: Negative for fever, chills, diaphoresis and fatigue.  HENT: Negative for ear pain, congestion, sore throat, facial swelling, neck pain, neck stiffness, dental problem and sinus pressure.   Eyes: Negative for photophobia, pain and discharge.  Respiratory: Negative for cough, chest tightness and wheezing.   Cardiovascular: Negative.   Gastrointestinal: Negative for nausea, vomiting, abdominal pain, diarrhea, constipation and abdominal distention.  Genitourinary: Positive for vaginal bleeding. Negative for dysuria, urgency, frequency, flank pain, vaginal discharge and difficulty urinating.  Musculoskeletal: Negative for myalgias, back pain and gait problem.    Skin: Negative for color change and rash.  Neurological: Negative for dizziness, speech difficulty, weakness, light-headedness, numbness and headaches.  Psychiatric/Behavioral: Negative for confusion and agitation. The patient is nervous/anxious.     Allergies  Aspirin  Home Medications  No current outpatient prescriptions on file.  BP 115/63  Pulse 72  Temp(Src) 98 F (36.7 C) (Oral)  Resp 20  Ht 5' 10.5" (1.791 m)  Wt 174 lb (78.926 kg)  BMI 24.61 kg/m2  SpO2 100%  Physical Exam  Nursing note and vitals reviewed. Constitutional: She is oriented to person, place, and time. She appears well-developed and well-nourished. No distress.  HENT:  Head: Normocephalic.  Eyes: EOM are normal.  Neck: Neck supple.  Pulmonary/Chest: Effort normal.  Abdominal: Soft. There is no tenderness.  Musculoskeletal: Normal range of motion.  Neurological: She is alert and oriented to person, place, and time. No cranial nerve deficit.  Skin: Skin is warm and dry.  Psychiatric: She has a normal mood and affect. Her behavior is normal. Judgment and thought content normal.   US Ob Comp Less 14 Wks  08/11/2011  *RADIOLOGY REPORT*  Clinical Data: Bleeding.  OBSTETRIC <14 WK ULTRASOUND  Technique:  Transabdominal ultrasound was performed for evaluation of the gestation as well as the maternal uterus and adnexal regions.  Comparison:  None.  Intrauterine gestational sac: Visualized Yolk sac: Not visualized Embryo: Visualized Cardiac Activity: No cardiac activity detected.  CRL:  6.8 mm  6w  4d          [place] EDC: 04/01/2012  Maternal uterus/Adnexae: Negative  IMPRESSION: 6.8 mm intrauterine embryo without evidence of heart beat consistent with failed IUP.  Original Report Authenticated By: Fuller Canada, M.D.   Assessment: Failed pregnancy  Plan: ED Course: medical screening exam complete and French Ana, CNM here to evaluate the patient further.  Procedures   MDM  Addendum: Assumed care of pt  after u/s report back. Rev'd u/s report w/ pt and her husband and disc'd IUFD at 6 weeks and 4 days. 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 Pt is feeling some abdominal pain and pressure, but some is mid and upper abdomen.  She hasn't taken any medicine thus far.  She and her husband talked and right now will proceed w/ expectant management and repeat u/s this Thursday at 1600 as already scheduled.  She will then decided if miscarriage incomplete, whether to proceed w/ D&E or cytotec induction.  She is also considering BTL and husband is considering vasectomy.  Bleeding and SAB precautions rev'd and pt d/c'd home to take Tylenol or Motrin prn, and also given Tylenol #3 Rx if add'l pain medicine needed.  F/u or call prn any worsening s/s or concerns.   Rexene Edison, CNM 08/12/11 0100.

## 2011-08-11 NOTE — Progress Notes (Signed)
Received phone call from [facility], [place] in [facility], to ask MAU provider to see pt.

## 2011-08-12 MED ORDER — ACETAMINOPHEN-CODEINE #3 300-30 MG PO TABS: 1.0000 | ORAL_TABLET | ORAL | Status: DC | PRN

## 2011-08-12 NOTE — Discharge Instructions (Signed)
Miscarriage  An early pregnancy loss or spontaneous abortion (miscarriage) is a common problem. This usually happens when the pregnancy is not developing normally. It is very unlikely that you or your partner did anything to cause this, although cigarette smoking, a sexually transmitted disease, excessive alcohol use, or drug abuse can increase the risk. Other causes are:   Abnormalities of the uterus.   Hormone or medical problems.   Trauma or genetic (chromosome) problems.  Having a miscarriage does not change your chances of having a normal pregnancy in the future. Your caregiver will advise you when it is safe to try to get pregnant again.  AFTER A MISCARRIAGE   A miscarriage is inevitable when there is continual, heavy vaginal bleeding; cramping; dilation of the cervix; or passing of any pregnancy tissue. Bleeding and cramping will usually continue until all the tissue has been removed from the womb (uterus).   Often the uterus does not clean itself out completely. A medication or a D&C procedure is needed to loosen or remove the pregnancy tissue from the uterus. A D&C scrapes or suctions the tissue out.   If you are RH negative, you may need to have Rh immune globulin to avoid Rh problems.   You may be given medication to fight an infection if the miscarriage was due to an infection.  HOME CARE INSTRUCTIONS    You should rest in bed for the next 2 to 3 days.   Do not take tub baths or put anything in your vagina, including tampons or a douche.   Do not have sex until your caregiver approves.   Avoid exercise or heavy activities until directed by your caregiver.   Save any vaginal discharge that looks like tissue. Ask your caregiver if he or she wants to inspect the discharge.   If you and your partner are having problems with guilt or grieving, talk to your caregiver or get counseling to help you understand and cope with your pregnancy loss.   Allow enough time to grieve before trying to get  pregnant again.  SEEK IMMEDIATE MEDICAL CARE IF:    You have persistent heavy bleeding or a bad smelling vaginal discharge.   You have continued abdominal or pelvic pain.   You have an oral temperature above 102 F (38.9 C), not controlled by medicine.   You have severe weakness, fainting, or keep throwing up (vomiting).   You develop chills.   You are experiencing domestic violence.  MAKE SURE YOU:    Understand these instructions.   Will watch your condition.   Will get help right away if you are not doing well or get worse.  Document Released: 05/30/2004 Document Revised: 04/11/2011 Document Reviewed: 04/14/2008  ExitCare Patient Information 2012 [employer].

## 2011-08-15 ENCOUNTER — Ambulatory Visit: Payer: BC Managed Care – PPO

## 2011-08-15 ENCOUNTER — Ambulatory Visit (INDEPENDENT_AMBULATORY_CARE_PROVIDER_SITE_OTHER): Payer: BC Managed Care – PPO | Admitting: Registered Nurse

## 2011-08-15 ENCOUNTER — Other Ambulatory Visit: Payer: Self-pay | Admitting: Obstetrics and Gynecology

## 2011-08-15 ENCOUNTER — Other Ambulatory Visit: Payer: Self-pay | Admitting: Registered Nurse

## 2011-08-15 ENCOUNTER — Encounter: Payer: Self-pay | Admitting: Registered Nurse

## 2011-08-15 ENCOUNTER — Other Ambulatory Visit: Payer: BC Managed Care – PPO

## 2011-08-15 DIAGNOSIS — O209 Hemorrhage in early pregnancy, unspecified: Secondary | ICD-10-CM | POA: Insufficient documentation

## 2011-08-15 DIAGNOSIS — O3680X0 Pregnancy with inconclusive fetal viability, not applicable or unspecified: Secondary | ICD-10-CM

## 2011-08-15 DIAGNOSIS — O039 Complete or unspecified spontaneous abortion without complication: Secondary | ICD-10-CM

## 2011-08-15 LAB — US OB TRANSVAGINAL

## 2011-08-15 NOTE — Progress Notes (Signed)
Patient ID: Paige Dunn, female   DOB: 11-07-1980, 31 y.o.   MRN: [id number] Patient reports to office today for f/u u/s re: 1st trimester bleeding and absent fetal heartrate on 4/7 u/s at [facility]. Rev'd u/s with patient and her husband. Options given. Patient desires D&E. Options given and discussed with patient. Patient desires D&E on 08/16/11. After consulting with SR, chart given to Asher Muir to place patient on surgery schedule for SR for 08/16/11.   U/S- EDD 04/08/2012 [redacted]w[redacted]d SAB, irregular gestational sac is noted,, normal ovaries, no fluid in CDS, no adnexal abnormalities.

## 2011-08-15 NOTE — Progress Notes (Signed)
Pt states she has not seen any blood for 2 days, prior spotting was light and only noticed spotting when she wiped No cramping or pain Pt has not had NOB appt scheduled yet

## 2011-08-15 NOTE — H&P (Addendum)
Demographics: 30 y//o Afican American female desires D&E for SAB.  HPI: Presents to office on today for f/u u/s from 08/11/11 indicative of 1st trimester SAB. U/S today in office noted [redacted]w[redacted]d irregular gestational sac, normal ovaries, no fluid in CDS, and no adnexal abnormalities detected.  She experienced abdominal pain and pressure on 08/11/11, seen at [facility].  Pregnancy Hx: 08/2011 (present)- SAB 01/2010- missed AB (D&E) 12/2005- rpt LTC/S @ 38 weeks  07/2004- SAB (D&E) 08/2003 primary cesarean section d//t breech presentation @ 39 weeks   Medications: Aspirin  Medications: PNV's   Family Hx: asthma, thyroid disease, cancer, intestinal problems (ulcers), SLE, HTN, anemia, diabetes, bladder infections, kidney problems, CVA, and cirrhosis.   Social Hx: Denies alcohol use or recreational use. Reports never smoking.   ROS:  Constitutional: Negative Eyes: Negative Resp: Negative Cardiovascular: Negative GI: Negative GU: Negative Skin: Negative Neuro: Negative  Physical Exam:(08/06/11) GI: without tenderness, masses, or organomegaly Vagina: Small amount of blood in vault Cervix: without tenderness or lesions Uterus: normal size, shape and consistency Adnexa: no masses, non-tender  Assessment/Plan Schedule D&E per patient request for 08/16/11 (consulted with SR)    

## 2011-08-16 ENCOUNTER — Encounter (HOSPITAL_COMMUNITY): Payer: Self-pay | Admitting: Anesthesiology

## 2011-08-16 ENCOUNTER — Encounter (HOSPITAL_COMMUNITY)
Admission: RE | Disposition: A | Discharge status: Home / Self Care | Payer: Self-pay | Source: Ambulatory Visit | Attending: Obstetrics and Gynecology

## 2011-08-16 ENCOUNTER — Encounter (HOSPITAL_COMMUNITY): Payer: Self-pay | Admitting: Pharmacist

## 2011-08-16 ENCOUNTER — Ambulatory Visit (HOSPITAL_COMMUNITY): Payer: BC Managed Care – PPO | Admitting: Anesthesiology

## 2011-08-16 ENCOUNTER — Ambulatory Visit (HOSPITAL_COMMUNITY)
Admission: RE | Admit: 2011-08-16 | Discharge: 2011-08-16 | Disposition: A | Discharge status: Home / Self Care | Payer: BC Managed Care – PPO | Source: Ambulatory Visit | Attending: Obstetrics and Gynecology | Admitting: Obstetrics and Gynecology

## 2011-08-16 ENCOUNTER — Encounter (HOSPITAL_COMMUNITY): Payer: Self-pay | Admitting: *Deleted

## 2011-08-16 DIAGNOSIS — O021 Missed abortion: Secondary | ICD-10-CM | POA: Insufficient documentation

## 2011-08-16 HISTORY — PX: DILATION AND EVACUATION: SHX1459

## 2011-08-16 LAB — DIFFERENTIAL
Basophils Absolute: 0 10*3/uL (ref 0.0–0.1)
Basophils Relative: 0 % (ref 0–1)
Eosinophils Absolute: 0.1 10*3/uL (ref 0.0–0.7)
Lymphs Abs: 1.7 10*3/uL (ref 0.7–4.0)
Neutrophils Relative %: 49 % (ref 43–77)

## 2011-08-16 LAB — CBC
MCH: 27.3 pg (ref 26.0–34.0)
MCHC: 33 g/dL (ref 30.0–36.0)
Platelets: 313 10*3/uL (ref 150–400)
RBC: 4.39 MIL/uL (ref 3.87–5.11)
RDW: 12.7 % (ref 11.5–15.5)

## 2011-08-16 SURGERY — DILATION AND EVACUATION, UTERUS
Anesthesia: Monitor Anesthesia Care | Site: Uterus | Wound class: Clean Contaminated

## 2011-08-16 MED ORDER — LIDOCAINE HCL (CARDIAC) 20 MG/ML IV SOLN
INTRAVENOUS | Status: AC
  Filled 2011-08-16: qty 5

## 2011-08-16 MED ORDER — PROMETHAZINE HCL 25 MG/ML IJ SOLN: 6.2500 mg | INTRAMUSCULAR | Status: DC | PRN

## 2011-08-16 MED ORDER — ONDANSETRON HCL 4 MG/2ML IJ SOLN
INTRAMUSCULAR | Status: DC | PRN
  Administered 2011-08-16: 4 mg via INTRAVENOUS

## 2011-08-16 MED ORDER — MIDAZOLAM HCL 2 MG/2ML IJ SOLN: 0.5000 mg | Freq: Once | INTRAMUSCULAR | Status: DC | PRN

## 2011-08-16 MED ORDER — CHLOROPROCAINE HCL 1 % IJ SOLN
INTRAMUSCULAR | Status: AC
  Filled 2011-08-16: qty 30

## 2011-08-16 MED ORDER — LIDOCAINE HCL (CARDIAC) 20 MG/ML IV SOLN
INTRAVENOUS | Status: DC | PRN
  Administered 2011-08-16: 60 mg via INTRAVENOUS

## 2011-08-16 MED ORDER — FENTANYL CITRATE 0.05 MG/ML IJ SOLN
INTRAMUSCULAR | Status: AC
  Administered 2011-08-16: 50 ug via INTRAVENOUS
  Filled 2011-08-16: qty 2

## 2011-08-16 MED ORDER — DEXAMETHASONE SODIUM PHOSPHATE 10 MG/ML IJ SOLN
INTRAMUSCULAR | Status: AC
  Filled 2011-08-16: qty 1

## 2011-08-16 MED ORDER — ACETAMINOPHEN 325 MG PO TABS: 325.0000 mg | ORAL_TABLET | ORAL | Status: DC | PRN

## 2011-08-16 MED ORDER — DIPHENHYDRAMINE HCL 50 MG/ML IJ SOLN
INTRAMUSCULAR | Status: AC
  Administered 2011-08-16: 12.5 mg via INTRAVENOUS
  Filled 2011-08-16: qty 1

## 2011-08-16 MED ORDER — MEPERIDINE HCL 25 MG/ML IJ SOLN: 6.2500 mg | INTRAMUSCULAR | Status: DC | PRN

## 2011-08-16 MED ORDER — PROPOFOL 10 MG/ML IV EMUL
INTRAVENOUS | Status: AC
  Filled 2011-08-16: qty 20

## 2011-08-16 MED ORDER — FENTANYL CITRATE 0.05 MG/ML IJ SOLN
25.0000 ug | INTRAMUSCULAR | Status: DC | PRN
  Administered 2011-08-16: 50 ug via INTRAVENOUS

## 2011-08-16 MED ORDER — KETOROLAC TROMETHAMINE 30 MG/ML IJ SOLN
INTRAMUSCULAR | Status: DC | PRN
  Administered 2011-08-16: 30 mg via INTRAVENOUS

## 2011-08-16 MED ORDER — DEXAMETHASONE SODIUM PHOSPHATE 10 MG/ML IJ SOLN
INTRAMUSCULAR | Status: DC | PRN
  Administered 2011-08-16: 10 mg via INTRAVENOUS

## 2011-08-16 MED ORDER — LACTATED RINGERS IV SOLN
INTRAVENOUS | Status: DC
  Administered 2011-08-16 (×2): via INTRAVENOUS

## 2011-08-16 MED ORDER — PROPOFOL 10 MG/ML IV EMUL
INTRAVENOUS | Status: AC
  Filled 2011-08-16: qty 40

## 2011-08-16 MED ORDER — MIDAZOLAM HCL 5 MG/5ML IJ SOLN
INTRAMUSCULAR | Status: DC | PRN
  Administered 2011-08-16: 2 mg via INTRAVENOUS

## 2011-08-16 MED ORDER — IBUPROFEN 600 MG PO TABS: 600.0000 mg | ORAL_TABLET | Freq: Four times a day (QID) | ORAL | Status: AC | PRN

## 2011-08-16 MED ORDER — DIPHENHYDRAMINE HCL 50 MG/ML IJ SOLN
12.5000 mg | Freq: Once | INTRAMUSCULAR | Status: AC
  Administered 2011-08-16: 12.5 mg via INTRAVENOUS

## 2011-08-16 MED ORDER — FENTANYL CITRATE 0.05 MG/ML IJ SOLN
INTRAMUSCULAR | Status: DC | PRN
  Administered 2011-08-16: 100 ug via INTRAVENOUS

## 2011-08-16 MED ORDER — ONDANSETRON HCL 4 MG/2ML IJ SOLN
INTRAMUSCULAR | Status: AC
  Filled 2011-08-16: qty 2

## 2011-08-16 MED ORDER — NORETHINDRONE ACET-ETHINYL EST 1-20 MG-MCG PO TABS: 1.0000 | ORAL_TABLET | Freq: Every day | ORAL | Status: DC

## 2011-08-16 MED ORDER — KETOROLAC TROMETHAMINE 60 MG/2ML IM SOLN
INTRAMUSCULAR | Status: AC
  Filled 2011-08-16: qty 2

## 2011-08-16 MED ORDER — MIDAZOLAM HCL 2 MG/2ML IJ SOLN
INTRAMUSCULAR | Status: AC
  Filled 2011-08-16: qty 2

## 2011-08-16 MED ORDER — FENTANYL CITRATE 0.05 MG/ML IJ SOLN
INTRAMUSCULAR | Status: AC
  Filled 2011-08-16: qty 2

## 2011-08-16 MED ORDER — CHLOROPROCAINE HCL 1 % IJ SOLN
INTRAMUSCULAR | Status: DC | PRN
  Administered 2011-08-16: 18 mL

## 2011-08-16 MED ORDER — CEFAZOLIN SODIUM 1-5 GM-% IV SOLN
1.0000 g | INTRAVENOUS | Status: AC
  Administered 2011-08-16: 1 g via INTRAVENOUS

## 2011-08-16 MED ORDER — PROPOFOL 10 MG/ML IV EMUL
INTRAVENOUS | Status: DC | PRN
  Administered 2011-08-16: 300 ug/kg/min via INTRAVENOUS

## 2011-08-16 MED ORDER — CEFAZOLIN SODIUM 1-5 GM-% IV SOLN
INTRAVENOUS | Status: AC
  Filled 2011-08-16: qty 50

## 2011-08-16 SURGICAL SUPPLY — 18 items
CATH ROBINSON RED A/P 16FR (CATHETERS) ×2 IMPLANT
CLOTH BEACON ORANGE TIMEOUT ST (SAFETY) ×2 IMPLANT
DECANTER SPIKE VIAL GLASS SM (MISCELLANEOUS) ×2 IMPLANT
GLOVE BIOGEL PI IND STRL 7.0 (GLOVE) ×2 IMPLANT
GLOVE BIOGEL PI INDICATOR 7.0 (GLOVE) ×2
GOWN PREVENTION PLUS LG XLONG (DISPOSABLE) ×2 IMPLANT
KIT BERKELEY 1ST TRIMESTER 3/8 (MISCELLANEOUS) ×2 IMPLANT
NEEDLE SPNL 22GX3.5 QUINCKE BK (NEEDLE) ×2 IMPLANT
NS IRRIG 1000ML POUR BTL (IV SOLUTION) ×2 IMPLANT
PACK VAGINAL MINOR WOMEN LF (CUSTOM PROCEDURE TRAY) ×2 IMPLANT
PAD PREP 24X48 CUFFED NSTRL (MISCELLANEOUS) ×2 IMPLANT
SET BERKELEY SUCTION TUBING (SUCTIONS) ×2 IMPLANT
SYR CONTROL 10ML LL (SYRINGE) ×2 IMPLANT
TOWEL OR 17X24 6PK STRL BLUE (TOWEL DISPOSABLE) ×2 IMPLANT
VACURETTE 10 RIGID CVD (CANNULA) IMPLANT
VACURETTE 7MM CVD STRL WRAP (CANNULA) IMPLANT
VACURETTE 8 RIGID CVD (CANNULA) IMPLANT
VACURETTE 9 RIGID CVD (CANNULA) ×2 IMPLANT

## 2011-08-16 NOTE — H&P (View-Only) (Signed)
Demographics: 67 y//o [place] American female desires D&E for SAB.  HPI: Presents to office on today for f/u u/s from 08/11/11 indicative of 1st trimester SAB. U/S today in office noted [redacted]w[redacted]d irregular gestational sac, normal ovaries, no fluid in CDS, and no adnexal abnormalities detected.  She experienced abdominal pain and pressure on 08/11/11, seen at [facility].  Pregnancy Hx: 08/2011 (present)- SAB 01/2010- missed AB (D&E) 12/2005- rpt LTC/S @ 38 weeks  07/2004- SAB (D&E) 08/2003 primary cesarean section d//t breech presentation @ 39 weeks   Medications: Aspirin  Medications: PNV's   Family Hx: asthma, thyroid disease, cancer, intestinal problems (ulcers), SLE, HTN, anemia, diabetes, bladder infections, kidney problems, CVA, and cirrhosis.   Social Hx: Denies alcohol use or recreational use. Reports never smoking.   ROS:  Constitutional: Negative Eyes: Negative Resp: Negative Cardiovascular: Negative GI: Negative GU: Negative Skin: Negative Neuro: Negative  Physical Exam:(08/06/11) GI: without tenderness, masses, or organomegaly Vagina: Small amount of blood in vault Cervix: without tenderness or lesions Uterus: normal size, shape and consistency Adnexa: no masses, non-tender  Assessment/Plan Schedule D&E per patient request for 08/16/11 (consulted with SR)

## 2011-08-16 NOTE — Discharge Instructions (Signed)
DISCHARGE INSTRUCTIONS: D&E The following instructions have been prepared to help you care for yourself upon your return home.   NO Ibuprofen containing meds until after 5:20 pm.  Personal hygiene: Marland Kitchen Use sanitary pads for vaginal drainage, not tampons. . Shower the day after your procedure. . NO tub baths, pools or Jacuzzis for 2-3 weeks. . Wipe front to back after using the bathroom.  Activity and limitations: . Do NOT drive or operate any equipment for 24 hours. The effects of anesthesia are still present and drowsiness may result. . Do NOT rest in bed all day. . Walking is encouraged. . Walk up and down stairs slowly. . You may resume your normal activity in one to two days or as indicated by your physician.  Sexual activity: NO intercourse for at least 2 weeks after the procedure, or as indicated by your physician.  Diet: Eat a light meal as desired this evening. You may resume your usual diet tomorrow.  Return to work: You may resume your work activities in one to two days or as indicated by your doctor.  What to expect after your surgery: Expect to have vaginal bleeding/discharge for 2-3 days and spotting for up to 10 days. It is not unusual to have soreness for up to 1-2 weeks. You may have a slight burning sensation when you urinate for the first day. Mild cramps may continue for a couple of days. You may have a regular period in 2-6 weeks.  Call your doctor for any of the following: . Excessive vaginal bleeding, saturating and changing one pad every hour. . Inability to urinate 6 hours after discharge from hospital. . Pain not relieved by pain medication. . Fever of 100.4 F or greater. . Unusual vaginal discharge or odor.  Return to office ________________ Call for an appointment ___________________  Patient's signature: ______________________  Nurse's signature ________________________

## 2011-08-16 NOTE — Op Note (Addendum)
Preoperative diagnosis: Missed AB  Postoperative diagnosis: Same  Anesthesia: IV sedation and paracervical block  Anesthesiologist: Dr. Brayton Caves  Procedure: D&E  Surgeon: Dr. Dois Davenport Aayush Gelpi  Estimated blood loss: Minimal  Procedure:  After being informed of the planned procedure with possible complications including bleeding, infection, injury to uterus, informed consent is obtained and patient is taken to or #8. She is given IV sedation without complication and placed in lithotomy position. She is prepped and draped in a sterile fashion. Her bladder is emptied with an in and out red rubber catheter.  Pelvic exam: Uterus is anteverted consistent with 6-8 weeks size. Both adnexa are normal.  A weighted speculum is inserted in the vagina and anterior lip of the cervix was grasped with a tenaculum forcep. Uterus is sounded at 9 cm. The cervix was easily dilated using Hegar dilator on total #30 one. This allows for easy entry of a #9 curved cannula. We proceed with evacuation of products of conception without complication. The cannula is removed and a sharp curet is used to confirm an empty uterine cavity. All instruments are then removed.  Instrument and sponge count is complete x2. Estimated blood loss is minimal. The procedure is well tolerated by the patient is taken to recovery room in a well and stable condition.  Specimen: Products of conception sent to pathology

## 2011-08-16 NOTE — Transfer of Care (Signed)
Immediate Anesthesia Transfer of Care Note  Patient: Paige Dunn  Procedure(s) Performed: Procedure(s) (LRB): DILATATION AND EVACUATION (N/A)  Patient Location: PACU  Anesthesia Type: MAC  Level of Consciousness: oriented and sedated  Airway & Oxygen Therapy: Patient Spontanous Breathing and Patient connected to nasal cannula oxygen  Post-op Assessment: Report given to PACU RN and Post -op Vital signs reviewed and stable  Post vital signs: stable  Complications: No apparent anesthesia complications

## 2011-08-16 NOTE — Interval H&P Note (Signed)
History and Physical Interval Note:  08/16/2011 10:22 AM  Paige Dunn  has presented today for surgery, with the diagnosis of missed abortion  The various methods of treatment have been discussed with the patient and family. After consideration of risks, benefits and other options for treatment, the patient has consented to  Procedure(s) (LRB): DILATATION AND EVACUATION (N/A) as a surgical intervention .  The patients' history has been reviewed, patient examined, no change in status, stable for surgery.  I have reviewed the patients' chart and labs.  Questions were answered to the patient's satisfaction.     Uzair Godley A

## 2011-08-16 NOTE — Anesthesia Preprocedure Evaluation (Signed)
Anesthesia Evaluation  Patient identified by MRN, date of birth, ID band Patient awake    Reviewed: Allergy & Precautions, H&P , Patient's Chart, lab work & pertinent test results, reviewed documented beta blocker date and time   History of Anesthesia Complications Negative for: history of anesthetic complications  Airway Mallampati: II TM Distance: >3 FB Neck ROM: full    Dental No notable dental hx.    Pulmonary neg pulmonary ROS,  breath sounds clear to auscultation  Pulmonary exam normal       Cardiovascular Exercise Tolerance: Good negative cardio ROS  Rhythm:regular Rate:Normal     Neuro/Psych negative neurological ROS  negative psych ROS   GI/Hepatic negative GI ROS, Neg liver ROS,   Endo/Other  negative endocrine ROS  Renal/GU negative Renal ROS     Musculoskeletal   Abdominal   Peds  Hematology negative hematology ROS (+)   Anesthesia Other Findings   Reproductive/Obstetrics negative OB ROS                           Anesthesia Physical Anesthesia Plan  ASA: I  Anesthesia Plan: MAC   Post-op Pain Management:    Induction:   Airway Management Planned:   Additional Equipment:   Intra-op Plan:   Post-operative Plan:   Informed Consent: I have reviewed the patients History and Physical, chart, labs and discussed the procedure including the risks, benefits and alternatives for the proposed anesthesia with the patient or authorized representative who has indicated his/her understanding and acceptance.   Dental Advisory Given  Plan Discussed with: CRNA, Surgeon and Anesthesiologist  Anesthesia Plan Comments:       Anesthesia Quick Evaluation  

## 2011-08-16 NOTE — Anesthesia Postprocedure Evaluation (Signed)
Anesthesia Post Note  Patient: Paige Dunn  Procedure(s) Performed: Procedure(s) (LRB): DILATATION AND EVACUATION (N/A)  Anesthesia type: MAC  Patient location: PACU  Post pain: Pain level controlled  Post assessment: Post-op Vital signs reviewed  Last Vitals:  Filed Vitals:   08/16/11 1145  BP: 120/74  Pulse: 76  Temp: 36.5 C  Resp: 26    Post vital signs: Reviewed  Level of consciousness: sedated  Complications: No apparent anesthesia complications

## 2011-08-16 NOTE — Progress Notes (Signed)
C/o itching after fentanyl.  Dr Rodman Pickle notified and order received for Benadryl IV.

## 2011-08-19 ENCOUNTER — Encounter (HOSPITAL_COMMUNITY): Payer: Self-pay | Admitting: Obstetrics and Gynecology

## 2011-08-20 ENCOUNTER — Ambulatory Visit: Payer: Self-pay | Admitting: Obstetrics and Gynecology

## 2011-09-03 ENCOUNTER — Ambulatory Visit (INDEPENDENT_AMBULATORY_CARE_PROVIDER_SITE_OTHER): Payer: BC Managed Care – PPO | Admitting: Obstetrics and Gynecology

## 2011-09-03 ENCOUNTER — Encounter: Payer: Self-pay | Admitting: Obstetrics and Gynecology

## 2011-09-03 VITALS — BP 118/68 | Temp 98.4°F | Ht 70.5 in | Wt 171.0 lb

## 2011-09-03 DIAGNOSIS — IMO0002 Reserved for concepts with insufficient information to code with codable children: Secondary | ICD-10-CM

## 2011-09-03 DIAGNOSIS — K649 Unspecified hemorrhoids: Secondary | ICD-10-CM

## 2011-09-03 MED ORDER — FERROUS SULFATE 325 (65 FE) MG PO TBEC: 325.0000 mg | DELAYED_RELEASE_TABLET | Freq: Two times a day (BID) | ORAL | Status: DC

## 2011-09-03 MED ORDER — PRAMOXINE HCL 1 % RE FOAM: RECTAL | Status: AC | PRN

## 2011-09-03 NOTE — Patient Instructions (Signed)

## 2011-09-03 NOTE — Progress Notes (Signed)
DATE OR SURGERY: 08/16/2011 PAIN:Yes, upper abdominal tenderness, back pain  VAG BLEEDING: yes VAG DISCHARGE: no NORMAL GI FUNCTN: no NORMAL GU FUNCTN: yes Pt thinks she may have a hemorrhoid.  Paige Dunn She has rectal pain and bleeding.  Pt also feels very tired.  No chest pain or SOB.  She also has epigastric pain.  No n/v BP 118/68  Temp(Src) 98.4 F (36.9 C) (Oral)  Ht 5' 10.5" (1.791 m)  Wt 171 lb (77.565 kg)  BMI 24.19 kg/m2  LMP 06/22/2011  Breastfeeding? Unknown  Physical Examination: General appearance - alert, well appearing, and in no distress Mental status - alert, oriented to person, place, and time, fatigued Chest - clear to auscultation, no wheezes, rales or rhonchi, symmetric air entry Heart - normal rate, regular rhythm, normal S1, S2, no murmurs, rubs, clicks or gallops Abdomen - soft, nontender, nondistended, no masses or organomegaly Pelvic - scant vaginal bleeding seen Back exam - full range of motion, no tenderness, palpable spasm or pain on motion Musculoskeletal - no joint tenderness, deformity or swelling Rectal small hemorrhoid tender no masses S/P D&E for missed Ab stable continue OCPS Fatigue vs grief.  Will monitor pt to follow up in 6 weeks for annual Hemmorhoid, epigastric pain and rectal bleeding.  Refer to GI.  procotofoam PRN Extremities - peripheral pulses normal, no pedal edema, no clubbing or cyanosis

## 2011-09-04 ENCOUNTER — Other Ambulatory Visit: Payer: Self-pay

## 2011-09-04 ENCOUNTER — Telehealth: Payer: Self-pay | Admitting: Obstetrics and Gynecology

## 2011-09-04 DIAGNOSIS — K625 Hemorrhage of anus and rectum: Secondary | ICD-10-CM

## 2011-09-04 DIAGNOSIS — R1013 Epigastric pain: Secondary | ICD-10-CM

## 2011-09-04 NOTE — Telephone Encounter (Signed)
Routed to niccole 

## 2011-09-04 NOTE — Progress Notes (Signed)
Referral to Dr.Mann for eval of rectal bleeding and epigastric pain all notes and demographics faxed to Dr.Mann office they will call pt and [place] to inform of appt

## 2011-09-04 DEATH — deceased

## 2011-10-14 ENCOUNTER — Encounter: Payer: Self-pay | Admitting: Obstetrics and Gynecology

## 2011-10-14 ENCOUNTER — Ambulatory Visit (INDEPENDENT_AMBULATORY_CARE_PROVIDER_SITE_OTHER): Payer: BC Managed Care – PPO | Admitting: Obstetrics and Gynecology

## 2011-10-14 VITALS — BP 114/60 | Resp 16 | Ht 70.5 in | Wt 177.0 lb

## 2011-10-14 DIAGNOSIS — Z01419 Encounter for gynecological examination (general) (routine) without abnormal findings: Secondary | ICD-10-CM

## 2011-10-14 DIAGNOSIS — Z3201 Encounter for pregnancy test, result positive: Secondary | ICD-10-CM

## 2011-10-14 DIAGNOSIS — Z124 Encounter for screening for malignant neoplasm of cervix: Secondary | ICD-10-CM

## 2011-10-14 LAB — POCT URINE PREGNANCY: Preg Test, Ur: NEGATIVE

## 2011-10-14 MED ORDER — NORETHINDRONE ACET-ETHINYL EST 1-20 MG-MCG PO TABS: 1.0000 | ORAL_TABLET | Freq: Every day | ORAL | Status: DC

## 2011-10-14 NOTE — Progress Notes (Signed)
Last Pap: 08/2010 WNL: Yes Regular Periods:no Contraception: yes  Monthly Breast exam:yes Tetanus<4yrs:yes Nl.Bladder Function:yes Daily BMs:yes Healthy Diet:yes Calcium:no Mammogram: yes Exercise:yes Seatbelt: yes Abuse at home: no Stressful work:no Sigmoid-colonoscopy: n/a Bone Density: No Pt without complaints Physical Examination: General appearance - alert, well appearing, and in no distress Mental status - normal mood, behavior, speech, dress, motor activity, and thought processes Neck - supple, no significant adenopathy, thyroid exam: thyroid is normal in size without nodules or tenderness Chest - clear to auscultation, no wheezes, rales or rhonchi, symmetric air entry Heart - normal rate and regular rhythm Abdomen - soft, nontender, nondistended, no masses or organomegaly Breasts - breasts appear normal, no suspicious masses, no skin or nipple changes or axillary nodes Pelvic - normal external genitalia, vulva, vagina, cervix, uterus and adnexa Rectal - normal rectal, no masses, rectal exam not indicated Back exam - full range of motion, no tenderness, palpable spasm or pain on motion Neurological - alert, oriented, normal speech, no focal findings or movement disorder noted Musculoskeletal - no joint tenderness, deformity or swelling Extremities - no edema, redness or tenderness in the calves or thighs Skin - normal coloration and turgor, no rashes, no suspicious skin lesions noted Routine exam Pap sent yes Mammogram due no Lo Estrin rx sent RT 1 yr

## 2011-10-16 LAB — PAP IG W/ RFLX HPV ASCU

## 2011-10-21 ENCOUNTER — Encounter: Payer: Self-pay | Admitting: Obstetrics and Gynecology

## 2011-12-06 ENCOUNTER — Telehealth: Payer: Self-pay | Admitting: Obstetrics and Gynecology

## 2011-12-06 ENCOUNTER — Encounter: Payer: Self-pay | Admitting: Obstetrics and Gynecology

## 2011-12-06 ENCOUNTER — Ambulatory Visit (INDEPENDENT_AMBULATORY_CARE_PROVIDER_SITE_OTHER): Payer: BC Managed Care – PPO | Admitting: Obstetrics and Gynecology

## 2011-12-06 VITALS — BP 110/74 | Resp 14 | Ht 70.0 in | Wt 176.0 lb

## 2011-12-06 DIAGNOSIS — N760 Acute vaginitis: Secondary | ICD-10-CM

## 2011-12-06 DIAGNOSIS — N9089 Other specified noninflammatory disorders of vulva and perineum: Secondary | ICD-10-CM

## 2011-12-06 DIAGNOSIS — N762 Acute vulvitis: Secondary | ICD-10-CM

## 2011-12-06 MED ORDER — CLOTRIMAZOLE-BETAMETHASONE 1-0.05 % EX CREA: TOPICAL_CREAM | Freq: Two times a day (BID) | CUTANEOUS | Status: DC

## 2011-12-06 NOTE — Progress Notes (Signed)
31 YO just noticed an irritating "mole" on vaginal area this week due to her clothes rubbing against it.  Denies bleeding or drainage. Currently on a medication for anal fissures with NTG in it.  Rectiv 0.4% Cream    O: Pelvic: EGBUS- under clitoral hood inflamed & mildly tender, left lateral hood with less than 1 mm raised nevus vs condylomatous lesion  A: Clitoral Hood Inflammation       Clitoral Hood Lesion  P: Lotrisone Cream # 15 grams bid x 7 days       RTO to follow up with Dr. Normand Sloop about clitoral hood lesion  Shuan Statzer,PA-C

## 2011-12-06 NOTE — Telephone Encounter (Signed)
Triage/epic 

## 2011-12-25 ENCOUNTER — Encounter: Payer: BC Managed Care – PPO | Admitting: Obstetrics and Gynecology

## 2012-02-03 ENCOUNTER — Emergency Department (HOSPITAL_BASED_OUTPATIENT_CLINIC_OR_DEPARTMENT_OTHER)
Admission: EM | Admit: 2012-02-03 | Discharge: 2012-02-03 | Disposition: A | Discharge status: Home / Self Care | Payer: BC Managed Care – PPO | Attending: Emergency Medicine | Admitting: Emergency Medicine

## 2012-02-03 ENCOUNTER — Emergency Department (HOSPITAL_BASED_OUTPATIENT_CLINIC_OR_DEPARTMENT_OTHER): Payer: BC Managed Care – PPO

## 2012-02-03 ENCOUNTER — Encounter (HOSPITAL_BASED_OUTPATIENT_CLINIC_OR_DEPARTMENT_OTHER): Payer: Self-pay | Admitting: *Deleted

## 2012-02-03 DIAGNOSIS — R109 Unspecified abdominal pain: Secondary | ICD-10-CM | POA: Insufficient documentation

## 2012-02-03 DIAGNOSIS — D1803 Hemangioma of intra-abdominal structures: Secondary | ICD-10-CM

## 2012-02-03 LAB — URINALYSIS, ROUTINE W REFLEX MICROSCOPIC
Bilirubin Urine: NEGATIVE
Glucose, UA: NEGATIVE mg/dL
Specific Gravity, Urine: 1.012 (ref 1.005–1.030)
pH: 6 (ref 5.0–8.0)

## 2012-02-03 LAB — URINE MICROSCOPIC-ADD ON

## 2012-02-03 LAB — COMPREHENSIVE METABOLIC PANEL
AST: 14 U/L (ref 0–37)
Albumin: 4.1 g/dL (ref 3.5–5.2)
BUN: 12 mg/dL (ref 6–23)
Chloride: 98 mEq/L (ref 96–112)
Creatinine, Ser: 0.6 mg/dL (ref 0.50–1.10)
Potassium: 3.8 mEq/L (ref 3.5–5.1)
Total Bilirubin: 0.4 mg/dL (ref 0.3–1.2)
Total Protein: 8.3 g/dL (ref 6.0–8.3)

## 2012-02-03 LAB — CBC WITH DIFFERENTIAL/PLATELET
Basophils Absolute: 0 10*3/uL (ref 0.0–0.1)
Basophils Relative: 0 % (ref 0–1)
Eosinophils Absolute: 0.1 10*3/uL (ref 0.0–0.7)
MCH: 28 pg (ref 26.0–34.0)
MCHC: 34 g/dL (ref 30.0–36.0)
Monocytes Absolute: 0.8 10*3/uL (ref 0.1–1.0)
Monocytes Relative: 10 % (ref 3–12)
Neutro Abs: 4.8 10*3/uL (ref 1.7–7.7)
Neutrophils Relative %: 57 % (ref 43–77)
RDW: 13 % (ref 11.5–15.5)

## 2012-02-03 LAB — PREGNANCY, URINE: Preg Test, Ur: NEGATIVE

## 2012-02-03 MED ORDER — IOHEXOL 300 MG/ML  SOLN
100.0000 mL | Freq: Once | INTRAMUSCULAR | Status: AC | PRN
  Administered 2012-02-03: 100 mL via INTRAVENOUS

## 2012-02-03 MED ORDER — SODIUM CHLORIDE 0.9 % IV SOLN
Freq: Once | INTRAVENOUS | Status: AC
  Administered 2012-02-03: 1000 mL via INTRAVENOUS

## 2012-02-03 MED ORDER — IOHEXOL 300 MG/ML  SOLN: 20.0000 mL | INTRAMUSCULAR | Status: AC

## 2012-02-03 NOTE — ED Notes (Signed)
Left mid quad pain x 1 week.

## 2012-02-03 NOTE — ED Notes (Signed)
Pt to CT

## 2012-02-03 NOTE — ED Provider Notes (Signed)
History     CSN: [id number]  Arrival date & time 02/03/12  1745   First MD Initiated Contact with Patient 02/03/12 1848      Chief Complaint  Patient presents with  . Abdominal Pain    (Consider location/radiation/quality/duration/timing/severity/associated sxs/prior treatment) Patient is a 31 y.o. female presenting with abdominal pain. The history is provided by the patient. No language interpreter was used.  Abdominal Pain The primary symptoms of the illness include abdominal pain. Episode onset: 1 week. The onset of the illness was gradual. The problem has been gradually worsening.  Associated with: pain to touch. The patient states that she believes she is currently not pregnant. The patient has not had a change in bowel habit. Symptoms associated with the illness do not include frequency or back pain. Significant associated medical issues do not include PUD or diverticulitis.  Pt complains of left sided abdominal pain for the past week.   Pt reports she recently was treated for a fissure, rectal pain and bleeding.  Pt reports she was given a cream that has nitroglycerin in it  Past Medical History  Diagnosis Date  . Cesarean delivery delivered   . Fibrocystic breast disease   . Headache     menstrual    Past Surgical History  Procedure Date  . D & c 2006, 2011  . Hysteroscopy 2004  . Laparoscopy 2004  . Dilation and evacuation 08/16/2011    Procedure: DILATATION AND EVACUATION;  Surgeon: Esmeralda Arthur, MD;  Location: [facility];  Service: Gynecology;  Laterality: N/A;  . Cesarean section   . Dilation and curettage of uterus     Family History  Problem Relation Age of Onset  . Heart attack Father   . Hypertension Maternal Grandmother     History  Substance Use Topics  . Smoking status: Never Smoker   . Smokeless tobacco: Never Used  . Alcohol Use: No    OB History    Grav Para Term Preterm Abortions TAB SAB Ect Mult Living   5 3        1       Review of  Systems  Gastrointestinal: Positive for abdominal pain.  Genitourinary: Negative for frequency.  Musculoskeletal: Negative for back pain.  All other systems reviewed and are negative.    Allergies  Aspirin  Home Medications   Current Outpatient Rx  Name Route Sig Dispense Refill  . CLOTRIMAZOLE-BETAMETHASONE 1-0.05 % EX CREA Topical Apply topically 2 (two) times daily. x 7-14 days 15 g 0  . FERROUS SULFATE 325 (65 FE) MG PO TBEC Oral Take 1 tablet (325 mg total) by mouth 2 (two) times daily with a meal. 90 tablet 11  . NORETHINDRONE ACET-ETHINYL EST 1-20 MG-MCG PO TABS Oral Take 1 tablet by mouth daily. 1 Package 11    BP 126/75  Pulse 76  Temp 97.9 F (36.6 C) (Oral)  Resp 16  SpO2 99%  LMP 02/02/2012  Physical Exam  Nursing note and vitals reviewed. Constitutional: She is oriented to person, place, and time. She appears well-developed and well-nourished.  HENT:  Head: Normocephalic and atraumatic.  Right Ear: External ear normal.  Left Ear: External ear normal.  Nose: Nose normal.  Mouth/Throat: Oropharynx is clear and moist.  Eyes: Conjunctivae normal and EOM are normal. Pupils are equal, round, and reactive to light.  Neck: Normal range of motion.  Cardiovascular: Normal rate, regular rhythm and normal heart sounds.   Pulmonary/Chest: Effort normal.  Abdominal: Soft. There is  tenderness.  Musculoskeletal: Normal range of motion.  Neurological: She is alert and oriented to person, place, and time. She has normal reflexes.  Skin: Skin is warm.  Psychiatric: She has a normal mood and affect.    ED Course  Procedures (including critical care time)  Labs Reviewed  URINALYSIS, ROUTINE W REFLEX MICROSCOPIC - Abnormal; Notable for the following:    Hgb urine dipstick MODERATE (*)     All other components within normal limits  URINE MICROSCOPIC-ADD ON - Abnormal; Notable for the following:    Squamous Epithelial / LPF FEW (*)     All other components within normal  limits  CBC WITH DIFFERENTIAL - Abnormal; Notable for the following:    HCT 35.3 (*)     All other components within normal limits  PREGNANCY, URINE  COMPREHENSIVE METABOLIC PANEL   Ct Abdomen Pelvis W Contrast  02/03/2012  *RADIOLOGY REPORT*  Clinical Data: Left lower quadrant abdominal pain and nausea. History of endometriosis and uterine fibroids.  CT ABDOMEN AND PELVIS WITH CONTRAST  Technique:  Multidetector CT imaging of the abdomen and pelvis was performed following the standard protocol during bolus administration of intravenous contrast.  Contrast: OMNIPAQUE IOHEXOL 300 MG/ML  SOLN  Comparison: Previous obstetrical ultrasounds.  Findings: Low density right lobe liver mass with peripheral contrast puddling on the initial images through the liver, best seen on the sagittal and coronal reconstruction images.  This measures 2.2 x 2.1 cm in maximum dimensions on image number 16 of series 2.  On the delayed images through the kidneys, this is partially included and demonstrates progressive peripheral contrast enhancement extending more centrally.  No additional liver masses are seen.  Unremarkable uterus, ovaries, urinary bladder, kidneys, adrenal glands, gallbladder, pancreas and spleen.  No gastrointestinal abnormalities or enlarged lymph nodes.  Normal appearing appendix in the right upper pelvis.  No colonic diverticula or evidence of diverticulitis.  No free peritoneal fluid or air.  Clear lung bases.  Levoconvex thoracolumbar scoliosis.  IMPRESSION:  1.  No acute abnormality. 2.  2.2 cm right lobe liver hemangioma.   Original Report Authenticated By: Darrol Angel, M.D.      1. Abdominal pain   2. Hemangioma of liver       MDM  Pt counseled on liver hemangioma        Elson Areas, [place] 02/03/12 2109  Lonia Skinner [place], [place] 02/03/12 2112

## 2012-02-03 NOTE — ED Provider Notes (Signed)
Medical screening examination/treatment/procedure(s) were performed by non-physician practitioner and as supervising physician I was immediately available for consultation/collaboration.  Ethelda Chick, MD 02/03/12 2114

## 2012-02-28 ENCOUNTER — Emergency Department (HOSPITAL_BASED_OUTPATIENT_CLINIC_OR_DEPARTMENT_OTHER)
Admission: EM | Admit: 2012-02-28 | Discharge: 2012-02-29 | Disposition: A | Discharge status: Home / Self Care | Payer: BC Managed Care – PPO | Attending: Emergency Medicine | Admitting: Emergency Medicine

## 2012-02-28 ENCOUNTER — Encounter (HOSPITAL_BASED_OUTPATIENT_CLINIC_OR_DEPARTMENT_OTHER): Payer: Self-pay | Admitting: Emergency Medicine

## 2012-02-28 ENCOUNTER — Emergency Department (HOSPITAL_BASED_OUTPATIENT_CLINIC_OR_DEPARTMENT_OTHER): Payer: BC Managed Care – PPO

## 2012-02-28 DIAGNOSIS — N6019 Diffuse cystic mastopathy of unspecified breast: Secondary | ICD-10-CM | POA: Insufficient documentation

## 2012-02-28 DIAGNOSIS — Y939 Activity, unspecified: Secondary | ICD-10-CM | POA: Insufficient documentation

## 2012-02-28 DIAGNOSIS — Y92009 Unspecified place in unspecified non-institutional (private) residence as the place of occurrence of the external cause: Secondary | ICD-10-CM | POA: Insufficient documentation

## 2012-02-28 DIAGNOSIS — X500XXA Overexertion from strenuous movement or load, initial encounter: Secondary | ICD-10-CM | POA: Insufficient documentation

## 2012-02-28 DIAGNOSIS — S93409A Sprain of unspecified ligament of unspecified ankle, initial encounter: Secondary | ICD-10-CM

## 2012-02-28 NOTE — ED Notes (Signed)
Pt c/o left ankle pain after jumping a fence tonight.

## 2012-02-29 ENCOUNTER — Emergency Department (HOSPITAL_BASED_OUTPATIENT_CLINIC_OR_DEPARTMENT_OTHER): Payer: BC Managed Care – PPO

## 2012-02-29 MED ORDER — HYDROCODONE-ACETAMINOPHEN 5-500 MG PO TABS: 1.0000 | ORAL_TABLET | Freq: Four times a day (QID) | ORAL | Status: DC | PRN

## 2012-02-29 MED ORDER — TRAMADOL HCL 50 MG PO TABS
50.0000 mg | ORAL_TABLET | Freq: Once | ORAL | Status: AC
  Administered 2012-02-29: 50 mg via ORAL
  Filled 2012-02-29: qty 1

## 2012-02-29 NOTE — ED Provider Notes (Signed)
History     CSN: [id number]  Arrival date & time 02/28/12  2335   First MD Initiated Contact with Patient 02/28/12 2354      Chief Complaint  Patient presents with  . Ankle Pain    (Consider location/radiation/quality/duration/timing/severity/associated sxs/prior treatment) Patient is a 31 y.o. female presenting with ankle pain. The history is provided by the patient.  Ankle Pain  The incident occurred 1 to 2 hours ago. The incident occurred in the yard (jumped a fence). The pain is present in the left ankle. The quality of the pain is described as aching. The pain is severe. The pain has been constant since onset. Pertinent negatives include no numbness, no inability to bear weight, no loss of motion, no muscle weakness, no loss of sensation and no tingling. She reports no foreign bodies present. Nothing aggravates the symptoms. She has tried nothing for the symptoms. The treatment provided no relief.    Past Medical History  Diagnosis Date  . Cesarean delivery delivered   . Fibrocystic breast disease   . Headache     menstrual    Past Surgical History  Procedure Date  . D & c 2006, 2011  . Hysteroscopy 2004  . Laparoscopy 2004  . Dilation and evacuation 08/16/2011    Procedure: DILATATION AND EVACUATION;  Surgeon: Esmeralda Arthur, MD;  Location: [facility];  Service: Gynecology;  Laterality: N/A;  . Cesarean section   . Dilation and curettage of uterus     Family History  Problem Relation Age of Onset  . Heart attack Father   . Hypertension Maternal Grandmother     History  Substance Use Topics  . Smoking status: Never Smoker   . Smokeless tobacco: Never Used  . Alcohol Use: No    OB History    Grav Para Term Preterm Abortions TAB SAB Ect Mult Living   5 3        1       Review of Systems  Neurological: Negative for tingling and numbness.  All other systems reviewed and are negative.    Allergies  Aspirin  Home Medications   Current Outpatient Rx    Name Route Sig Dispense Refill  . NORETHINDRONE ACET-ETHINYL EST 1-20 MG-MCG PO TABS Oral Take 1 tablet by mouth daily. 1 Package 11  . CLOTRIMAZOLE-BETAMETHASONE 1-0.05 % EX CREA Topical Apply topically 2 (two) times daily. x 7-14 days 15 g 0  . FERROUS SULFATE 325 (65 FE) MG PO TBEC Oral Take 1 tablet (325 mg total) by mouth 2 (two) times daily with a meal. 90 tablet 11    BP 123/65  Pulse 92  Temp 98.1 F (36.7 C) (Oral)  Resp 18  Ht 5\' 10"  (1.778 m)  Wt 170 lb (77.111 kg)  BMI 24.39 kg/m2  SpO2 100%  LMP 02/02/2012  Physical Exam  Constitutional: She is oriented to person, place, and time. She appears well-developed and well-nourished.  HENT:  Head: Normocephalic and atraumatic.  Eyes: Conjunctivae normal and EOM are normal.  Neck: Normal range of motion. Neck supple.  Cardiovascular: Normal rate and regular rhythm.   Pulmonary/Chest: Effort normal and breath sounds normal. No respiratory distress.  Abdominal: Soft. Bowel sounds are normal. There is no tenderness.  Musculoskeletal: Normal range of motion. She exhibits no edema.       Pain with palpation of ankle FROm dosrsalis pedis 2+ in left foot FROM of the toes of the left foot.  Cap refill of toes < 2  sec  Neurological: She is oriented to person, place, and time.  Skin: Skin is warm and dry.  Psychiatric: She has a normal mood and affect.    ED Course  Procedures (including critical care time)  Labs Reviewed - No data to display Dg Ankle Complete Left  02/29/2012  *RADIOLOGY REPORT*  Clinical Data: Trauma, left foot/ankle pain  LEFT ANKLE COMPLETE - 3+ VIEW  Comparison: None.  Findings: No fracture or dislocation is seen.  The ankle mortise is intact.  The base of the fifth metatarsal is unremarkable.  The visualized soft tissues are unremarkable.  IMPRESSION: No fracture or dislocation is seen.   Original Report Authenticated By: Charline Bills, M.D.    Dg Foot Complete Left  02/29/2012  *RADIOLOGY REPORT*   Clinical Data: Trauma, left foot/ankle pain  LEFT FOOT - COMPLETE 3+ VIEW  Comparison: None.  Findings: No fracture or dislocation is seen.  The joint spaces are preserved.  The visualized soft tissues are unremarkable.  IMPRESSION: No fracture or dislocation is seen.   Original Report Authenticated By: Charline Bills, M.D.      No diagnosis found.    MDM  Sprain of ankle ASa and pain meds        Shahana Capes K Bernardo Brayman-Rasch, MD 02/29/12 0139

## 2012-02-29 NOTE — ED Notes (Signed)
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 States that she stumbled on left foot upon landing and continued to run across the field. States left ankle/foot pain 10/10. Good pedal pulse and capillary refill.

## 2012-04-13 ENCOUNTER — Encounter: Payer: BC Managed Care – PPO | Admitting: Obstetrics and Gynecology

## 2012-04-23 ENCOUNTER — Encounter: Payer: BC Managed Care – PPO | Admitting: Obstetrics and Gynecology

## 2012-05-10 ENCOUNTER — Emergency Department (HOSPITAL_BASED_OUTPATIENT_CLINIC_OR_DEPARTMENT_OTHER)
Admission: EM | Admit: 2012-05-10 | Discharge: 2012-05-10 | Disposition: A | Discharge status: Home / Self Care | Payer: BC Managed Care – PPO | Attending: Emergency Medicine | Admitting: Emergency Medicine

## 2012-05-10 ENCOUNTER — Encounter (HOSPITAL_BASED_OUTPATIENT_CLINIC_OR_DEPARTMENT_OTHER): Payer: Self-pay | Admitting: Emergency Medicine

## 2012-05-10 DIAGNOSIS — J111 Influenza due to unidentified influenza virus with other respiratory manifestations: Secondary | ICD-10-CM | POA: Insufficient documentation

## 2012-05-10 DIAGNOSIS — J3489 Other specified disorders of nose and nasal sinuses: Secondary | ICD-10-CM | POA: Insufficient documentation

## 2012-05-10 DIAGNOSIS — Z79899 Other long term (current) drug therapy: Secondary | ICD-10-CM | POA: Insufficient documentation

## 2012-05-10 DIAGNOSIS — Z8742 Personal history of other diseases of the female genital tract: Secondary | ICD-10-CM | POA: Insufficient documentation

## 2012-05-10 DIAGNOSIS — R42 Dizziness and giddiness: Secondary | ICD-10-CM | POA: Insufficient documentation

## 2012-05-10 MED ORDER — HYDROCOD POLST-CHLORPHEN POLST 10-8 MG/5ML PO LQCR: 5.0000 mL | Freq: Two times a day (BID) | ORAL | Status: DC | PRN

## 2012-05-10 MED ORDER — SODIUM CHLORIDE 0.9 % IV BOLUS (SEPSIS)
1000.0000 mL | Freq: Once | INTRAVENOUS | Status: AC
  Administered 2012-05-10: 1000 mL via INTRAVENOUS

## 2012-05-10 NOTE — ED Notes (Signed)
Pt having flu-like symptoms for over one week.  Congestion, cough, fever, body aches, green sputum.   Some nausea, no V/D.

## 2012-05-10 NOTE — ED Notes (Signed)
Pt's son was sick last week with strep throat.

## 2012-05-10 NOTE — ED Provider Notes (Signed)
History     CSN: [id number]  Arrival date & time 05/10/12  1205   First MD Initiated Contact with Patient 05/10/12 1321      Chief Complaint  Patient presents with  . Nasal Congestion  . Cough  . flu like symptoms     (Consider location/radiation/quality/duration/timing/severity/associated sxs/prior treatment) HPI Comments: Pt presents to the ED with complaints of flu-like symptoms of dry cough, nasal congestion, sore throat, muscle aches, chills, fevers, headache, decreased appetite, nausea, and fatigue. The patient states that the symptoms started six days ago.  Pt has been around other sick contacts and did not get the flu shot this year. The patient denies neck pain or stiffness, weakness, vision changes, severe abdominal pain, inability to eat or drink, difficulty breathing, SOB, wheezing, or chest pain. The patient has tried Mucinex, Tylenol, and rest but has only felt mild relief.  She reports that today she began feeling lightheaded and felt that she was dehydrated.  She has been nauseous and drinking less.  No vomiting or diarrhea.  She is otherwise healthy.  Patient is a 32 y.o. female presenting with cough. The history is provided by the patient.  Cough Associated symptoms include chills, headaches, rhinorrhea and sore throat. Pertinent negatives include no shortness of breath and no wheezing.    Past Medical History  Diagnosis Date  . Cesarean delivery delivered   . Fibrocystic breast disease   . Headache     menstrual    Past Surgical History  Procedure Date  . D & c 2006, 2011  . Hysteroscopy 2004  . Laparoscopy 2004  . Dilation and evacuation 08/16/2011    Procedure: DILATATION AND EVACUATION;  Surgeon: Esmeralda Arthur, MD;  Location: [facility];  Service: Gynecology;  Laterality: N/A;  . Cesarean section   . Dilation and curettage of uterus     Family History  Problem Relation Age of Onset  . Heart attack Father   . Hypertension Maternal Grandmother      History  Substance Use Topics  . Smoking status: Never Smoker   . Smokeless tobacco: Never Used  . Alcohol Use: No    OB History    Grav Para Term Preterm Abortions TAB SAB Ect Mult Living   5 3        1       Review of Systems  Constitutional: Positive for fever, chills, appetite change and fatigue.  HENT: Positive for congestion, sore throat and rhinorrhea. Negative for drooling, trouble swallowing, neck pain, neck stiffness and voice change.   Respiratory: Positive for cough. Negative for shortness of breath and wheezing.   Gastrointestinal: Positive for nausea. Negative for vomiting, abdominal pain and diarrhea.  Genitourinary: Negative for dysuria, urgency, frequency and decreased urine volume.  Skin: Negative for rash.  Neurological: Positive for light-headedness and headaches. Negative for syncope.  Psychiatric/Behavioral: Negative for confusion.    Allergies  Aspirin  Home Medications   Current Outpatient Rx  Name  Route  Sig  Dispense  Refill  . NORETHINDRONE ACET-ETHINYL EST 1-20 MG-MCG PO TABS   Oral   Take 1 tablet by mouth daily.   1 Package   11     BP 101/66  Pulse 78  Temp 98.7 F (37.1 C) (Oral)  Resp 20  Ht 5' 10.5" (1.791 m)  Wt 171 lb (77.565 kg)  BMI 24.19 kg/m2  SpO2 100%  LMP 04/21/2012  Physical Exam  Nursing note and vitals reviewed. Constitutional: She appears well-developed and well-nourished.  No distress.  HENT:  Head: Normocephalic and atraumatic.  Right Ear: Tympanic membrane and ear canal normal.  Left Ear: Tympanic membrane and ear canal normal.  Nose: Nose normal.  Mouth/Throat: Uvula is midline, oropharynx is clear and moist and mucous membranes are normal.  Eyes: EOM are normal. Pupils are equal, round, and reactive to light.  Neck: Normal range of motion. Neck supple.  Cardiovascular: Normal rate, regular rhythm and normal heart sounds.   Pulmonary/Chest: Effort normal and breath sounds normal. No respiratory  distress. She has no wheezes. She has no rales.  Abdominal: Soft. There is no tenderness.  Neurological: She is alert.  Skin: Skin is warm and dry. No rash noted. She is not diaphoretic.  Psychiatric: She has a normal mood and affect.    ED Course  Procedures (including critical care time)  Labs Reviewed - No data to display No results found.   No diagnosis found.  Dizziness resolved after IVF.    MDM  Patient with symptoms consistent with influenza.  Vitals are stable, low-grade fever.  No signs of dehydration, tolerating PO's.  Lungs are clear. Due to patient's presentation and physical exam a chest x-ray was not ordered bc likely diagnosis of flu.  Discussed the cost versus benefit of Tamiflu treatment with the patient.  The patient understands that symptoms are greater than the recommended 24-48 hour window of treatment.  Patient will be discharged with instructions to orally hydrate, rest, and use over-the-counter medications such as anti-inflammatories ibuprofen and Aleve for muscle aches and Tylenol for fever.  Patient will also be given a cough suppressant.         Pascal Lux [place], PA-C 05/10/12 2258

## 2012-05-14 NOTE — ED Provider Notes (Signed)
Medical screening examination/treatment/procedure(s) were performed by non-physician practitioner and as supervising physician I was immediately available for consultation/collaboration.   Donaldo Teegarden, MD 05/14/12 0747 

## 2012-07-14 ENCOUNTER — Encounter (HOSPITAL_COMMUNITY): Payer: Self-pay | Admitting: *Deleted

## 2012-07-14 ENCOUNTER — Emergency Department (HOSPITAL_COMMUNITY)
Admission: EM | Admit: 2012-07-14 | Discharge: 2012-07-15 | Disposition: A | Discharge status: Home / Self Care | Payer: BC Managed Care – PPO | Attending: Emergency Medicine | Admitting: Emergency Medicine

## 2012-07-14 ENCOUNTER — Emergency Department (HOSPITAL_COMMUNITY): Payer: BC Managed Care – PPO

## 2012-07-14 DIAGNOSIS — Z8742 Personal history of other diseases of the female genital tract: Secondary | ICD-10-CM | POA: Insufficient documentation

## 2012-07-14 DIAGNOSIS — Z79899 Other long term (current) drug therapy: Secondary | ICD-10-CM | POA: Insufficient documentation

## 2012-07-14 DIAGNOSIS — R569 Unspecified convulsions: Secondary | ICD-10-CM

## 2012-07-14 DIAGNOSIS — Z3202 Encounter for pregnancy test, result negative: Secondary | ICD-10-CM | POA: Insufficient documentation

## 2012-07-14 DIAGNOSIS — R51 Headache: Secondary | ICD-10-CM | POA: Insufficient documentation

## 2012-07-14 LAB — CBC WITH DIFFERENTIAL/PLATELET
Basophils Absolute: 0 10*3/uL (ref 0.0–0.1)
Basophils Relative: 0 % (ref 0–1)
Eosinophils Absolute: 0.1 10*3/uL (ref 0.0–0.7)
Eosinophils Relative: 1 % (ref 0–5)
HCT: 33.7 % — ABNORMAL LOW (ref 36.0–46.0)
Hemoglobin: 11.6 g/dL — ABNORMAL LOW (ref 12.0–15.0)
Lymphocytes Relative: 28 % (ref 12–46)
Lymphs Abs: 1.5 10*3/uL (ref 0.7–4.0)
MCH: 28.4 pg (ref 26.0–34.0)
MCHC: 34.4 g/dL (ref 30.0–36.0)
MCV: 82.4 fL (ref 78.0–100.0)
Monocytes Absolute: 0.5 10*3/uL (ref 0.1–1.0)
Monocytes Relative: 9 % (ref 3–12)
Neutro Abs: 3.4 10*3/uL (ref 1.7–7.7)
Neutrophils Relative %: 62 % (ref 43–77)
Platelets: 304 10*3/uL (ref 150–400)
RBC: 4.09 MIL/uL (ref 3.87–5.11)
RDW: 12.9 % (ref 11.5–15.5)
WBC: 5.5 10*3/uL (ref 4.0–10.5)

## 2012-07-14 LAB — COMPREHENSIVE METABOLIC PANEL
ALT: 17 U/L (ref 0–35)
AST: 16 U/L (ref 0–37)
Albumin: 3.7 g/dL (ref 3.5–5.2)
Alkaline Phosphatase: 74 U/L (ref 39–117)
BUN: 11 mg/dL (ref 6–23)
CO2: 27 mEq/L (ref 19–32)
Calcium: 9.4 mg/dL (ref 8.4–10.5)
Chloride: 100 mEq/L (ref 96–112)
Creatinine, Ser: 0.65 mg/dL (ref 0.50–1.10)
GFR calc Af Amer: >90 mL/min (ref >90)
GFR calc non Af Amer: >90 mL/min (ref >90)
Glucose, Bld: 98 mg/dL (ref 70–99)
Potassium: 3.7 mEq/L (ref 3.5–5.1)
Sodium: 136 mEq/L (ref 135–145)
Total Bilirubin: 0.3 mg/dL (ref 0.3–1.2)
Total Protein: 7.4 g/dL (ref 6.0–8.3)

## 2012-07-14 LAB — URINALYSIS, ROUTINE W REFLEX MICROSCOPIC
Bilirubin Urine: NEGATIVE
Glucose, UA: NEGATIVE mg/dL
Hgb urine dipstick: NEGATIVE
Ketones, ur: NEGATIVE mg/dL
Leukocytes, UA: NEGATIVE
Nitrite: NEGATIVE
Protein, ur: NEGATIVE mg/dL
Specific Gravity, Urine: 1.011 (ref 1.005–1.030)
Urobilinogen, UA: 0.2 mg/dL (ref 0.0–1.0)
pH: 7 (ref 5.0–8.0)

## 2012-07-14 LAB — PREGNANCY, URINE: Preg Test, Ur: NEGATIVE

## 2012-07-14 NOTE — ED Provider Notes (Signed)
History     CSN: [id number]  Arrival date & time 07/14/12  2127   First MD Initiated Contact with Patient 07/14/12 2143      Chief Complaint  Patient presents with  . poss seizure     (Consider location/radiation/quality/duration/timing/severity/associated sxs/prior treatment) HPI Comments: Patient presents to the ED today after a new onset seizure.  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  I spoke with Professor on the phone.  According to the Professor the patient had generalized convulsions of upper extremities and lower extremities bilaterally earlier today.  Patient loss consciousness during the episode.  Episode lasted approximately 2-3 minutes.  The patient was sitting during the episode and did not hit her head.  No bowel or bladder incontinence.  She had some initial confusion and headache after the episode.  Confusion has resolved, but she continues to have a headache at this time.  Aside from the headache she feels that she is back at her baseline at this time.  No prior history of Seizure Disorder.  She denies any recent illness.  She denies any precipitating factors.  She states that she felt fine prior to the seizure episode.  No fever or chills.  Denies alcohol or recreational drug use.  She has been under increased stress over the past couple of weeks.  She has been eating and drinking normally.  No headache or visual complaints prior to seizure.    The history is provided by the patient.    Past Medical History  Diagnosis Date  . Cesarean delivery delivered   . Fibrocystic breast disease   . Headache     menstrual    Past Surgical History  Procedure Laterality Date  . D & c  2006, 2011  . Hysteroscopy  2004  . Laparoscopy  2004  . Dilation and evacuation  08/16/2011    Procedure: DILATATION AND EVACUATION;  Surgeon: Esmeralda Arthur, MD;  Location: [facility];  Service: Gynecology;  Laterality: N/A;  . Cesarean section    . Dilation and curettage of uterus       Family History  Problem Relation Age of Onset  . Heart attack Father   . Hypertension Maternal Grandmother     History  Substance Use Topics  . Smoking status: Never Smoker   . Smokeless tobacco: Never Used  . Alcohol Use: No    OB History   Grav Para Term Preterm Abortions TAB SAB Ect Mult Living   5 3        1       Review of Systems  Constitutional: Negative for fever and chills.  Eyes: Negative for visual disturbance.  Respiratory: Negative for shortness of breath.   Gastrointestinal: Negative for nausea and vomiting.  Neurological: Positive for seizures and headaches. Negative for dizziness, speech difficulty, weakness, light-headedness and numbness.  All other systems reviewed and are negative.    Allergies  Aspirin  Home Medications   Current Outpatient Rx  Name  Route  Sig  Dispense  Refill  . chlorpheniramine-HYDROcodone (TUSSIONEX PENNKINETIC ER) 10-8 MG/5ML LQCR   Oral   Take 5 mLs by mouth every 12 (twelve) hours as needed.   140 mL   0   . norethindrone-ethinyl estradiol (LOESTRIN 1/20, 21,) 1-20 MG-MCG tablet   Oral   Take 1 tablet by mouth daily.   1 Package   11     BP 127/65  Pulse 94  Temp(Src) 97.9 F (36.6 C) (Oral)  Resp 18  SpO2 100%  LMP 07/10/2012  Physical Exam  Nursing note and vitals reviewed. Constitutional: She is oriented to person, place, and time. She appears well-developed and well-nourished. No distress.  HENT:  Head: Normocephalic and atraumatic.  Mouth/Throat: Oropharynx is clear and moist.  Eyes: EOM are normal. Pupils are equal, round, and reactive to light.  Neck: Normal range of motion. Neck supple.  Cardiovascular: Normal rate, regular rhythm and normal heart sounds.   Pulmonary/Chest: Effort normal and breath sounds normal.  Musculoskeletal: Normal range of motion.  Neurological: She is alert and oriented to person, place, and time. She has normal strength. No cranial nerve deficit or sensory deficit.  Gait normal.  Skin: Skin is warm and dry. She is not diaphoretic.  Psychiatric: She has a normal mood and affect.    ED Course  Procedures (including critical care time)  Labs Reviewed  CBC WITH DIFFERENTIAL  COMPREHENSIVE METABOLIC PANEL  URINALYSIS, ROUTINE W REFLEX MICROSCOPIC  PREGNANCY, URINE  URINE RAPID DRUG SCREEN (HOSP PERFORMED)   Ct Head Wo Contrast  07/14/2012  *RADIOLOGY REPORT*  Clinical Data: New onset seizure, headache  CT HEAD WITHOUT CONTRAST  Technique:  Contiguous axial images were obtained from the base of the skull through the vertex without contrast.  Comparison: None  Findings: Normal ventricular morphology. No midline shift or mass effect. Normal appearance of brain parenchyma. No intracranial hemorrhage, mass lesion, or acute infarction. Visualized paranasal sinuses and mastoid air cells clear. Bones unremarkable.  IMPRESSION: No acute intracranial abnormalities.   Original Report Authenticated By: Ulyses Southward, M.D.      No diagnosis found.   Date: 07/15/2012  Rate: 91  Rhythm: normal sinus rhythm  QRS Axis: normal  Intervals: normal  ST/T Wave abnormalities: normal  Conduction Disutrbances:none  Narrative Interpretation:   Old EKG Reviewed: none available    2:02 AM Patient reports that her headache has improved at this time.  MDM  Patient presenting with new onset seizure.  Labs unremarkable.  No acute findings on Head CT.  Patient with normal neurological exam.  Patient presented with post ictal headache, but headache resolved prior to discharge.  Patient at baseline at the time of discharge.  Patient given referral to Neurology for further work up.  Feel that the patient is stable for discharge.  Return precautions given.          Pascal Lux Page, PA-C 07/15/12 1425

## 2012-07-14 NOTE — ED Notes (Signed)
Patient returned from CT

## 2012-07-14 NOTE — ED Notes (Signed)
The pt  Is c/o nausea since the episode.  ems gave zofran 4mg  iv enroute she has not had any relief yet

## 2012-07-14 NOTE — ED Notes (Signed)
The pt arrived [facility] [employer] [facility] [employer].  She was in class and slumped over and had a jerking motion for 1-2 minutes.  No incontinence of bowel or bladder.  Sl abrasion to the rt side of her tongue.  Alert at present c/o a severe headache.  She does not usually have headaches or seizures.  She remembers talking to a classmate and the next thing she remembers is talking to the paramedics.  Alert skin  Warm and dry.  Asking for her husband

## 2012-07-15 LAB — RAPID URINE DRUG SCREEN, HOSP PERFORMED
Amphetamines: NOT DETECTED
Barbiturates: NOT DETECTED
Benzodiazepines: NOT DETECTED
Cocaine: NOT DETECTED
Opiates: NOT DETECTED
Tetrahydrocannabinol: NOT DETECTED

## 2012-07-15 MED ORDER — ONDANSETRON HCL 4 MG/2ML IJ SOLN
INTRAMUSCULAR | Status: AC
  Administered 2012-07-15: 03:00:00
  Filled 2012-07-15: qty 2

## 2012-07-15 MED ORDER — OXYCODONE-ACETAMINOPHEN 5-325 MG PO TABS
2.0000 | ORAL_TABLET | Freq: Once | ORAL | Status: AC
  Administered 2012-07-15: 1 via ORAL
  Filled 2012-07-15: qty 2

## 2012-07-15 MED ORDER — KETOROLAC TROMETHAMINE 30 MG/ML IJ SOLN
30.0000 mg | Freq: Once | INTRAMUSCULAR | Status: AC
  Administered 2012-07-15: 30 mg via INTRAVENOUS
  Filled 2012-07-15: qty 1

## 2012-07-16 NOTE — ED Provider Notes (Signed)
Medical screening examination/treatment/procedure(s) were performed by non-physician practitioner and as supervising physician I was immediately available for consultation/collaboration.  Stephen Kohut, MD 07/16/12 0100 

## 2012-07-21 ENCOUNTER — Other Ambulatory Visit: Payer: Self-pay | Admitting: Neurology

## 2012-07-21 DIAGNOSIS — G40309 Generalized idiopathic epilepsy and epileptic syndromes, not intractable, without status epilepticus: Secondary | ICD-10-CM

## 2012-07-22 ENCOUNTER — Telehealth: Payer: Self-pay | Admitting: Neurology

## 2012-07-22 ENCOUNTER — Ambulatory Visit (INDEPENDENT_AMBULATORY_CARE_PROVIDER_SITE_OTHER): Payer: BC Managed Care – PPO | Admitting: Radiology

## 2012-07-22 ENCOUNTER — Ambulatory Visit (INDEPENDENT_AMBULATORY_CARE_PROVIDER_SITE_OTHER): Payer: BC Managed Care – PPO

## 2012-07-22 DIAGNOSIS — G40309 Generalized idiopathic epilepsy and epileptic syndromes, not intractable, without status epilepticus: Secondary | ICD-10-CM

## 2012-07-22 MED ORDER — LAMOTRIGINE 25 MG PO TABS: 75.0000 mg | ORAL_TABLET | Freq: Two times a day (BID) | ORAL | Status: DC

## 2012-07-22 NOTE — Progress Notes (Signed)
  Paige Dunn is a 32 year old patient with a history of a single seizure event that occurred on 07/14/2012. The patient felt confused for several months prior to the seizure, and then she lost consciousness with a generalized jerking event associated with tongue biting. The patient is being evaluated for the seizure.  This is a routine EEG. No skull defects are noted. The patient is on no medications.  EEG classification: Dysrhythmia grade 2 left temporal  Description of the recording: The background rhythm of this recording consists of a well modulated medium amplitude alpha rhythm of 9 Hz that is reactive to eye opening and closure. As the record progresses, the patient initially is in the waking state, but as the record progresses, the patient enters the sleeping state with a generalized slowing seen, and some vertex sharp wave activity and sleep spindles are seen during stage II sleep. Throughout the recording, minimal slight dysrhythmic theta slowing is seen emanating from the left temporal region. At one time, the patient generates sharp transients emanating from the left temporal region. At no time during the recording does there appear to be evidence of spike or spike wave discharges. Photic stimulation is performed, and this results in a bilateral and symmetric photic driving response. Hyperventilation is also performed, and this results in a minimal buildup of the background rhythm activities without significant slowing seen. EKG monitor shows no evidence of cardiac rhythm abnormalities, with a heart rate of 78.  Impression: This is an abnormal EEG recording secondary to dysrhythmic theta activity and sharp transients emanating from the left temporal area. This study suggests a lowered seizure threshold, with focality localizing to the left hemisphere. No electrographic seizures were recorded.

## 2012-07-22 NOTE — Telephone Encounter (Signed)
I called patient. The EEG study was abnormal, showing left-sided dysrhythmia. I will start the patient on lamotrigine, working up slowly on the dose.

## 2012-07-23 ENCOUNTER — Telehealth: Payer: Self-pay | Admitting: Neurology

## 2012-07-23 MED ORDER — GADOPENTETATE DIMEGLUMINE 469.01 MG/ML IV SOLN: 17.0000 mL | Freq: Once | INTRAVENOUS | Status: AC | PRN

## 2012-07-23 NOTE — Procedures (Signed)
[facility]  NEUROIMAGING REPORT   STUDY DATE: 07/22/12 PATIENT NAME: Paige Dunn DOB: 1981/01/11 MRN: [id number]  ORDERING CLINICIAN: York Spaniel, MD  CLINICAL HISTORY: 32 year old female with seizure.  EXAM: MRI brain (with and without)  TECHNIQUE: MRI of the brain with and without contrast was obtained utilizing 5 mm axial slices with T1, T2, T2 flair, T2 star gradient echo and diffusion weighted views.  T1 sagittal, T2 coronal and postcontrast views in the axial and coronal plane were obtained. CONTRAST: 17 ml magnevist IMAGING SITE: [facility] [address]   FINDINGS:  Metallic artifact emanates from oropharynx related to orthodontic hardware.  No abnormal lesions are seen on diffusion-weighted views to suggest acute ischemia. The cortical sulci, fissures and cisterns are normal in size and appearance. Lateral, third and fourth ventricle are normal in size and appearance. No extra-axial fluid collections are seen. No evidence of mass effect or midline shift.    No abnormal lesions are seen on post contrast views.  On sagittal views the posterior fossa, pituitary gland and corpus callosum are notable for partially empty sella. No evidence of intracranial hemorrhage on gradient-echo views. The orbits and their contents, paranasal sinuses are difficult to visualize due to metallic artifact. Calvarium is unremarkable.  Intracranial flow voids are present.  IMPRESSION:  Normal MRI brain (without). Incidental partially empty sella syndrome.   INTERPRETING PHYSICIAN:  Suanne Marker, MD Certified in Neurology, Neurophysiology and Neuroimaging  [facility] [address], [address] [place], [place] [address] [phone]

## 2012-07-23 NOTE — Telephone Encounter (Signed)
I called the patient. The MRI of the brain is normal. The patient is to go on Lamictal. No driving for 6 months.

## 2012-08-10 ENCOUNTER — Other Ambulatory Visit: Payer: Self-pay | Admitting: Internal Medicine

## 2012-08-10 DIAGNOSIS — N63 Unspecified lump in unspecified breast: Secondary | ICD-10-CM

## 2012-08-12 ENCOUNTER — Telehealth: Payer: Self-pay | Admitting: *Deleted

## 2012-08-12 MED ORDER — LEVETIRACETAM 500 MG PO TABS: 500.0000 mg | ORAL_TABLET | Freq: Two times a day (BID) | ORAL | Status: DC

## 2012-08-12 NOTE — Telephone Encounter (Signed)
Patient called stating she thinks she's having side effects from Lamictal. Patient started having symptoms last Friday, such as joint pain, neck pain and fatigue and she went to her pcp who informed her that she didn't have the flu. Please advise.

## 2012-08-12 NOTE — Telephone Encounter (Signed)
I Called patient. The patient is having neuromuscular discomfort on the Lamictal. This can occur with Lamictal, but it is unusual. The patient will taper down from 50 mg twice daily to 25 mg twice daily for one week, and then stop the medication. The patient will be started on Keppra, and she will take 250 mg twice daily for one week, then go to 500 mg twice daily. The patient will watch out for drowsiness or irritability.

## 2012-08-13 ENCOUNTER — Emergency Department (HOSPITAL_BASED_OUTPATIENT_CLINIC_OR_DEPARTMENT_OTHER)
Admission: EM | Admit: 2012-08-13 | Discharge: 2012-08-13 | Disposition: A | Discharge status: Home / Self Care | Payer: BC Managed Care – PPO | Attending: Emergency Medicine | Admitting: Emergency Medicine

## 2012-08-13 ENCOUNTER — Emergency Department (HOSPITAL_BASED_OUTPATIENT_CLINIC_OR_DEPARTMENT_OTHER): Payer: BC Managed Care – PPO

## 2012-08-13 ENCOUNTER — Encounter (HOSPITAL_BASED_OUTPATIENT_CLINIC_OR_DEPARTMENT_OTHER): Payer: Self-pay | Admitting: *Deleted

## 2012-08-13 DIAGNOSIS — Z79899 Other long term (current) drug therapy: Secondary | ICD-10-CM | POA: Insufficient documentation

## 2012-08-13 DIAGNOSIS — J069 Acute upper respiratory infection, unspecified: Secondary | ICD-10-CM | POA: Insufficient documentation

## 2012-08-13 DIAGNOSIS — R05 Cough: Secondary | ICD-10-CM | POA: Insufficient documentation

## 2012-08-13 DIAGNOSIS — Z8742 Personal history of other diseases of the female genital tract: Secondary | ICD-10-CM | POA: Insufficient documentation

## 2012-08-13 DIAGNOSIS — J029 Acute pharyngitis, unspecified: Secondary | ICD-10-CM | POA: Insufficient documentation

## 2012-08-13 DIAGNOSIS — G40909 Epilepsy, unspecified, not intractable, without status epilepticus: Secondary | ICD-10-CM | POA: Insufficient documentation

## 2012-08-13 DIAGNOSIS — Z8669 Personal history of other diseases of the nervous system and sense organs: Secondary | ICD-10-CM | POA: Insufficient documentation

## 2012-08-13 DIAGNOSIS — R059 Cough, unspecified: Secondary | ICD-10-CM | POA: Insufficient documentation

## 2012-08-13 DIAGNOSIS — IMO0001 Reserved for inherently not codable concepts without codable children: Secondary | ICD-10-CM | POA: Insufficient documentation

## 2012-08-13 LAB — URINALYSIS, ROUTINE W REFLEX MICROSCOPIC
Ketones, ur: NEGATIVE mg/dL
Leukocytes, UA: NEGATIVE
Nitrite: NEGATIVE
Specific Gravity, Urine: 1.028 (ref 1.005–1.030)
pH: 5.5 (ref 5.0–8.0)

## 2012-08-13 NOTE — ED Notes (Signed)
Fever, cough and neck pain. Symptoms x 5 days. States she just started a seizure medication and her MD told her the symptoms may be a result of the medication but she needed to be seen to rule out infection.

## 2012-08-13 NOTE — ED Provider Notes (Signed)
History     CSN: [id number]  Arrival date & time 08/13/12  1510   First MD Initiated Contact with Patient 08/13/12 1520      Chief Complaint  Patient presents with  . Fever    (Consider location/radiation/quality/duration/timing/severity/associated sxs/prior treatment) Patient is a 32 y.o. female presenting with fever.  Fever  Pt reports she had a seizure earlier this month, started on Lamictal. She increased her dose per Neurology recommendations about 5 days ago and since then has noticed diffuse myalgias. She spoke to her Neurologist last night who began taper of her Lamictal with plan to switch to Keppra next week, but also advised her to be seen sooner if she began to run a fever. States she has had some sore throat, but really is soreness in neck muscles. No pain with swallowing. Her pain is moderate, aching and worse with movement. She also developed a dry cough last night and fever to 102F. She took APAP with improvement. No return of fever today. States she had blood work done at PCP office 2 days ago. Does not know results.   Past Medical History  Diagnosis Date  . Cesarean delivery delivered   . Fibrocystic breast disease   . Headache     menstrual    Past Surgical History  Procedure Laterality Date  . D & c  2006, 2011  . Hysteroscopy  2004  . Laparoscopy  2004  . Dilation and evacuation  08/16/2011    Procedure: DILATATION AND EVACUATION;  Surgeon: Esmeralda Arthur, MD;  Location: [facility];  Service: Gynecology;  Laterality: N/A;  . Cesarean section    . Dilation and curettage of uterus      Family History  Problem Relation Age of Onset  . Heart attack Father   . Hypertension Maternal Grandmother     History  Substance Use Topics  . Smoking status: Never Smoker   . Smokeless tobacco: Never Used  . Alcohol Use: No    OB History   Grav Para Term Preterm Abortions TAB SAB Ect Mult Living   5 3        1       Review of Systems  Constitutional: Positive  for fever.   All other systems reviewed and are negative except as noted in HPI.   Allergies  Aspirin and Lamictal  Home Medications   Current Outpatient Rx  Name  Route  Sig  Dispense  Refill  . LamoTRIgine (LAMICTAL PO)   Oral   Take by mouth.         Marland Kitchen acetaminophen (TYLENOL) 325 MG tablet   Oral   Take 650 mg by mouth every 6 (six) hours as needed for pain.         Marland Kitchen levETIRAcetam (KEPPRA) 500 MG tablet   Oral   Take 1 tablet (500 mg total) by mouth 2 (two) times daily.   60 tablet   11     BP 109/72  Pulse 94  Temp(Src) 98.7 F (37.1 C) (Oral)  Resp 20  Wt 171 lb (77.565 kg)  BMI 24.18 kg/m2  SpO2 100%  LMP 07/10/2012  Physical Exam  Nursing note and vitals reviewed. Constitutional: She is oriented to person, place, and time. She appears well-developed and well-nourished.  HENT:  Head: Normocephalic and atraumatic.  Eyes: EOM are normal. Pupils are equal, round, and reactive to light.  Neck: Normal range of motion. Neck supple.  No meningismus  Cardiovascular: Normal rate, normal heart sounds  and intact distal pulses.   Pulmonary/Chest: Effort normal and breath sounds normal.  Abdominal: Bowel sounds are normal. She exhibits no distension. There is no tenderness.  Musculoskeletal: Normal range of motion. She exhibits no edema and no tenderness.  Neurological: She is alert and oriented to person, place, and time. She has normal strength. She displays normal reflexes. No cranial nerve deficit or sensory deficit. She exhibits normal muscle tone. Coordination normal.  No cogwheel rigidity  Skin: Skin is warm and dry. No rash noted.  Psychiatric: She has a normal mood and affect.    ED Course  Procedures (including critical care time)  Labs Reviewed  URINALYSIS, ROUTINE W REFLEX MICROSCOPIC   Dg Chest 2 View  08/13/2012  *RADIOLOGY REPORT*  Clinical Data: Cough, fever.  CHEST - 2 VIEW  Comparison: July 27, 2011.  Findings: Cardiomediastinal  silhouette appears normal.  No acute pulmonary disease is noted.  Bony thorax is intact.  IMPRESSION: No acute cardiopulmonary abnormality seen.   Original Report Authenticated By: Lupita Raider.,  M.D.      1. URI (upper respiratory infection)       MDM  Pt with normal CXR and UA. Cough and fever today likely viral URI. I have been unsuccessful in getting labs from PCP office. No concern for neuroleptic malignant syndrome. Doubt this is hypersensitivity reaction or rhabdo. Advised to return for worsening.         Charles B. Bernette Mayers, MD 08/13/12 1642

## 2012-08-13 NOTE — ED Notes (Signed)
Faxed medical release to Doctor office for information on patient for Dr. Bernette Mayers

## 2012-08-19 ENCOUNTER — Ambulatory Visit
Admission: RE | Admit: 2012-08-19 | Discharge: 2012-08-19 | Disposition: A | Discharge status: Home / Self Care | Payer: BC Managed Care – PPO | Source: Ambulatory Visit | Attending: Internal Medicine | Admitting: Internal Medicine

## 2012-08-19 ENCOUNTER — Other Ambulatory Visit: Payer: BC Managed Care – PPO

## 2012-08-19 DIAGNOSIS — N63 Unspecified lump in unspecified breast: Secondary | ICD-10-CM

## 2012-09-19 ENCOUNTER — Emergency Department (HOSPITAL_COMMUNITY): Payer: BC Managed Care – PPO

## 2012-09-19 ENCOUNTER — Encounter (HOSPITAL_COMMUNITY): Payer: Self-pay | Admitting: *Deleted

## 2012-09-19 ENCOUNTER — Emergency Department (HOSPITAL_COMMUNITY)
Admission: EM | Admit: 2012-09-19 | Discharge: 2012-09-20 | Disposition: A | Discharge status: Home / Self Care | Payer: BC Managed Care – PPO | Attending: Emergency Medicine | Admitting: Emergency Medicine

## 2012-09-19 DIAGNOSIS — Z8742 Personal history of other diseases of the female genital tract: Secondary | ICD-10-CM | POA: Insufficient documentation

## 2012-09-19 DIAGNOSIS — Z79899 Other long term (current) drug therapy: Secondary | ICD-10-CM | POA: Insufficient documentation

## 2012-09-19 DIAGNOSIS — R51 Headache: Secondary | ICD-10-CM | POA: Insufficient documentation

## 2012-09-19 DIAGNOSIS — G40909 Epilepsy, unspecified, not intractable, without status epilepticus: Secondary | ICD-10-CM | POA: Insufficient documentation

## 2012-09-19 LAB — BASIC METABOLIC PANEL
BUN: 9 mg/dL (ref 6–23)
Creatinine, Ser: 0.71 mg/dL (ref 0.50–1.10)
GFR calc Af Amer: >90 mL/min (ref >90)
GFR calc non Af Amer: >90 mL/min (ref >90)

## 2012-09-19 LAB — CBC
HCT: 35 % — ABNORMAL LOW (ref 36.0–46.0)
MCHC: 35.1 g/dL (ref 30.0–36.0)
MCV: 80.8 fL (ref 78.0–100.0)
Platelets: 303 10*3/uL (ref 150–400)
RDW: 12.9 % (ref 11.5–15.5)

## 2012-09-19 MED ORDER — ONDANSETRON HCL 4 MG/2ML IJ SOLN
4.0000 mg | Freq: Once | INTRAMUSCULAR | Status: AC
  Administered 2012-09-19: 4 mg via INTRAVENOUS
  Filled 2012-09-19: qty 2

## 2012-09-19 MED ORDER — SODIUM CHLORIDE 0.9 % IV SOLN
1000.0000 mg | Freq: Once | INTRAVENOUS | Status: AC
  Administered 2012-09-19: 1000 mg via INTRAVENOUS
  Filled 2012-09-19: qty 10

## 2012-09-19 NOTE — ED Notes (Signed)
Pt states she experienced a seizure and fell off her bed. C/o non radiating central CP. Pain exacerbated by deep breaths, pain relived by nothing.

## 2012-09-19 NOTE — ED Notes (Signed)
Pupils Equal round and reactive. Grip equal bilaterally.  A&O x 4

## 2012-09-19 NOTE — ED Provider Notes (Signed)
History     CSN: [id number]  Arrival date & time 09/19/12  2045   First MD Initiated Contact with Patient 09/19/12 2224      Chief Complaint  Patient presents with  . Chest Pain  . Seizures    (Consider location/radiation/quality/duration/timing/severity/associated sxs/prior treatment) HPI Comments: Patient presents after a witnessed seizure by her husband. She has a history of her first seizure March 11 and has been on Keppra. She was on lamictal but that was tapered off. She reports missing the last 3 doses of her Keppra. She denies any fever, chills, nausea vomiting. Good by mouth intake and urine output. Her husband helped her to the ground and said she had gentle, clonic activity lasting for 2 minutes. Some confusion afterwards which has resolved. No tongue biting urinary incontinence. Afterwards she had some tightness in her chest and shortness of breath which concerned her. This has since resolved as well. Her chest is still sore to palpation however. Denies any trauma to the chest. Denies any birth control use. She is back to baseline per her husband.  The history is provided by the patient.    Past Medical History  Diagnosis Date  . Cesarean delivery delivered   . Fibrocystic breast disease   . Headache     menstrual    Past Surgical History  Procedure Laterality Date  . D & c  2006, 2011  . Hysteroscopy  2004  . Laparoscopy  2004  . Dilation and evacuation  08/16/2011    Procedure: DILATATION AND EVACUATION;  Surgeon: Esmeralda Arthur, MD;  Location: [facility];  Service: Gynecology;  Laterality: N/A;  . Cesarean section    . Dilation and curettage of uterus      Family History  Problem Relation Age of Onset  . Heart attack Father   . Hypertension Maternal Grandmother     History  Substance Use Topics  . Smoking status: Never Smoker   . Smokeless tobacco: Never Used  . Alcohol Use: No    OB History   Grav Para Term Preterm Abortions TAB SAB Ect Mult Living   5 3        1       Review of Systems  Constitutional: Negative for fever, activity change and appetite change.  Respiratory: Negative for cough, chest tightness and shortness of breath.   Cardiovascular: Positive for chest pain.  Gastrointestinal: Negative for nausea, vomiting and abdominal pain.  Genitourinary: Negative for dysuria, hematuria, vaginal bleeding and vaginal discharge.  Musculoskeletal: Negative for back pain.  Skin: Negative for rash.  Neurological: Positive for seizures and headaches. Negative for dizziness.    Allergies  Aspirin and Lamictal  Home Medications   Current Outpatient Rx  Name  Route  Sig  Dispense  Refill  . Cyanocobalamin (B-12 PO)   Oral   Take 1 tablet by mouth daily.         . IRON PO   Oral   Take 1 tablet by mouth daily.         Marland Kitchen levETIRAcetam (KEPPRA) 500 MG tablet   Oral   Take 1 tablet (500 mg total) by mouth 2 (two) times daily.   60 tablet   11   . Multiple Vitamin (MULTIVITAMIN WITH MINERALS) TABS   Oral   Take 1 tablet by mouth daily.         . Naproxen Sodium (ALEVE PO)   Oral   Take 1-2 tablets by mouth 2 times daily at  12 noon and 4 pm.         . acetaminophen (TYLENOL) 325 MG tablet   Oral   Take 650 mg by mouth every 6 (six) hours as needed for pain or fever (headache).            BP 114/70  Pulse 86  Temp(Src) 98.1 F (36.7 C) (Oral)  Resp 18  SpO2 100%  LMP 08/19/2012  Physical Exam  Constitutional: She is oriented to person, place, and time. She appears well-developed and well-nourished. No distress.  HENT:  Head: Normocephalic and atraumatic.  Mouth/Throat: Oropharynx is clear and moist. No oropharyngeal exudate.  No tongue laceration  Eyes: Conjunctivae are normal. Pupils are equal, round, and reactive to light.  Neck: Normal range of motion. Neck supple.  Cardiovascular: Normal rate, regular rhythm and normal heart sounds.   No murmur heard. Pulmonary/Chest: Effort normal. No  respiratory distress. She exhibits tenderness.  L chest tender to palpation.  Abdominal: Soft. There is no tenderness. There is no rebound and no guarding.  Musculoskeletal: Normal range of motion. She exhibits no edema and no tenderness.  Neurological: She is alert and oriented to person, place, and time. No cranial nerve deficit. She exhibits normal muscle tone. Coordination normal.  CN 2-12 intact, 5/5 strength throughout, no ataxia on finger to nose.  Skin: Skin is warm.    ED Course  Procedures (including critical care time)  Labs Reviewed  CBC - Abnormal; Notable for the following:    HCT 35.0 (*)    All other components within normal limits  BASIC METABOLIC PANEL - Abnormal; Notable for the following:    Glucose, Bld 114 (*)    All other components within normal limits  POCT I-STAT TROPONIN I   Dg Chest 2 View  09/19/2012   *RADIOLOGY REPORT*  Clinical Data: Chest pain and seizure  CHEST - 2 VIEW  Comparison: 08/13/2012  Findings: The heart size and mediastinal contours are within normal limits.  Both lungs are clear.  The visualized skeletal structures are unremarkable.  IMPRESSION: Negative exam.   Original Report Authenticated By: Signa Kell, M.D.   Ct Head Wo Contrast  09/19/2012   *RADIOLOGY REPORT*  Clinical Data: Seizure.  Fall.  Chest pain.  CT HEAD WITHOUT CONTRAST  Technique:  Contiguous axial images were obtained from the base of the skull through the vertex without contrast.  Comparison: 07/14/2012  Findings: Mild chronic right sphenoid sinusitis.  The brain stem, cerebellum, cerebral peduncles, thalami, basal ganglia, basilar cisterns, and ventricular system appear unremarkable.  No intracranial hemorrhage, mass lesion, or acute infarction is identified.  IMPRESSION:  1.  Mild chronic right sphenoid sinusitis.  Otherwise negative.   Original Report Authenticated By: Gaylyn Rong, M.D.     1. Seizure disorder       MDM  Witnessed seizure after missing 3  doses of her seizure medication. Now back to baseline. No focal deficits. No tongue biting or incontinence.  Chest pain has resolved. Lungs are clear.no shortness of breath. PERC negative.   Discussed with Dr. Roseanne Reno of neurology.Agrees with 1 g loading dose of keppra as patient has missed 3 doses. D/w patient importance of compliance with medications.     Date: 09/19/2012  Rate: 81  Rhythm: normal sinus rhythm  QRS Axis: normal  Intervals: normal  ST/T Wave abnormalities: normal  Conduction Disutrbances:none  Narrative Interpretation:   Old EKG Reviewed: unchanged      Glynn Octave, MD 09/20/12 [phone]

## 2012-10-14 ENCOUNTER — Telehealth: Payer: Self-pay | Admitting: Neurology

## 2012-10-15 NOTE — Telephone Encounter (Signed)
Spoke to patient. Says she is experiencing extreme fatigue, mild chest pain (4 on pain scale, past 3 days) w/ possible SOB, and feeling of disconnection after taking morning meds. Morning meds: Iron (causes constipation), Vitamin D, and Keppra. Pt reports sz on 07/14/12 and 09/19/12 went to ER was told to f/u with neurologist and PCP after 5/17 sz. Pt saw PCP who recommended neurologist is notified of symptoms also. Pt has scheduled OV w/ NP on 12/09/12 but is concerned because sx(s) are increasing.

## 2012-10-15 NOTE — Telephone Encounter (Signed)
Returned call. No answer. Vmail has not been set up as of yet.

## 2012-10-15 NOTE — Telephone Encounter (Signed)
I called the patient, unable to reach her at this time, I will call back later.

## 2012-10-15 NOTE — Telephone Encounter (Signed)
I called patient. The patient is having some irritability on the Keppra, some fatigue. The patient reports some stomach upset, but she is also taking iron. The patient will stop iron for several days to see this is the issue of the stomach upset. If the patient cannot tolerate the Keppra, we could try Keppra XR tablets. The patient did have a brief seizure event in May, but she had missed 3 doses of her medications prior to the onset of the seizure.

## 2012-11-18 ENCOUNTER — Emergency Department (HOSPITAL_BASED_OUTPATIENT_CLINIC_OR_DEPARTMENT_OTHER)
Admission: EM | Admit: 2012-11-18 | Discharge: 2012-11-18 | Disposition: A | Discharge status: Home / Self Care | Payer: BC Managed Care – PPO | Attending: Emergency Medicine | Admitting: Emergency Medicine

## 2012-11-18 ENCOUNTER — Encounter (HOSPITAL_BASED_OUTPATIENT_CLINIC_OR_DEPARTMENT_OTHER): Payer: Self-pay

## 2012-11-18 DIAGNOSIS — B9689 Other specified bacterial agents as the cause of diseases classified elsewhere: Secondary | ICD-10-CM

## 2012-11-18 DIAGNOSIS — M545 Low back pain, unspecified: Secondary | ICD-10-CM | POA: Insufficient documentation

## 2012-11-18 DIAGNOSIS — Z791 Long term (current) use of non-steroidal anti-inflammatories (NSAID): Secondary | ICD-10-CM | POA: Insufficient documentation

## 2012-11-18 DIAGNOSIS — Z79899 Other long term (current) drug therapy: Secondary | ICD-10-CM | POA: Insufficient documentation

## 2012-11-18 DIAGNOSIS — N898 Other specified noninflammatory disorders of vagina: Secondary | ICD-10-CM | POA: Insufficient documentation

## 2012-11-18 DIAGNOSIS — Z3202 Encounter for pregnancy test, result negative: Secondary | ICD-10-CM | POA: Insufficient documentation

## 2012-11-18 DIAGNOSIS — R300 Dysuria: Secondary | ICD-10-CM | POA: Insufficient documentation

## 2012-11-18 DIAGNOSIS — R350 Frequency of micturition: Secondary | ICD-10-CM | POA: Insufficient documentation

## 2012-11-18 DIAGNOSIS — N760 Acute vaginitis: Secondary | ICD-10-CM | POA: Insufficient documentation

## 2012-11-18 DIAGNOSIS — N390 Urinary tract infection, site not specified: Secondary | ICD-10-CM

## 2012-11-18 HISTORY — DX: Unspecified convulsions: R56.9

## 2012-11-18 LAB — URINALYSIS, ROUTINE W REFLEX MICROSCOPIC
Bilirubin Urine: NEGATIVE
Glucose, UA: NEGATIVE mg/dL
Ketones, ur: NEGATIVE mg/dL
Protein, ur: >300 mg/dL — AB

## 2012-11-18 LAB — PREGNANCY, URINE: Preg Test, Ur: NEGATIVE

## 2012-11-18 LAB — WET PREP, GENITAL

## 2012-11-18 LAB — URINE MICROSCOPIC-ADD ON

## 2012-11-18 MED ORDER — SULFAMETHOXAZOLE-TRIMETHOPRIM 800-160 MG PO TABS: 1.0000 | ORAL_TABLET | Freq: Two times a day (BID) | ORAL | Status: DC

## 2012-11-18 MED ORDER — METRONIDAZOLE 500 MG PO TABS: 500.0000 mg | ORAL_TABLET | Freq: Two times a day (BID) | ORAL | Status: DC

## 2012-11-18 NOTE — ED Notes (Signed)
Pt to [facility] via w/c-urine obtained-to tx area via w/c

## 2012-11-18 NOTE — ED Provider Notes (Signed)
History    CSN: [id number] Arrival date & time 11/18/12  2132  First MD Initiated Contact with Patient 11/18/12 2150     Chief Complaint  Patient presents with  . Abdominal Pain   (Consider location/radiation/quality/duration/timing/severity/associated sxs/prior Treatment) Patient is a 32 y.o. female presenting with abdominal pain. The history is provided by the patient.  Abdominal Pain This is a new problem. Associated symptoms include abdominal pain. Pertinent negatives include no chest pain, no headaches and no shortness of breath.   patient has had some lower back pain for the last month. Is worse with movement. She began to have lower abdominal pain today. She states she also has had some urinary frequency. She also is having some pain with urination. No vaginal bleeding or discharge. She recently finished her period. Patient states the cramping in her abdomen feels somewhat like her period/no fevers.   P=ast Surgical History  Procedure Laterality Date  . D & c  2006, 2011  . Hysteroscopy  2004  . Laparoscopy  2004  . Dilation and evacuation  08/16/2011    Procedure: DILATATION AND EVACUATION;  Surgeon: Esmeralda Arthur, MD;  Location: [facility];  Service: Gynecology;  Laterality: N/A;  . Cesarean section    . Dilation and curettage of uterus     Family History  Problem Relation Age of Onset  . Heart attack Father   . Hypertension Maternal Grandmother    History  Substance Use Topics  . Smoking status: Never Smoker   . Smokeless tobacco: Never Used  . Alcohol Use: No   OB History   Grav Para Term Preterm Abortions TAB SAB Ect Mult Living   5 3        1      Review of Systems  Constitutional: Negative for activity change and appetite change.  HENT: Negative for neck stiffness.   Eyes: Negative for pain.  Respiratory: Negative for chest tightness and shortness of breath.   Cardiovascular: Negative for chest pain and leg swelling.  Gastrointestinal: Positive for  abdominal pain. Negative for nausea, vomiting and diarrhea.  Genitourinary: Negative for flank pain.  Musculoskeletal: Positive for back pain.  Skin: Negative for rash.  Neurological: Negative for weakness, numbness and headaches.  Psychiatric/Behavioral: Negative for behavioral problems.    Allergies  Aspirin and Lamictal  Home Medications   Current Outpatient Rx  Name  Route  Sig  Dispense  Refill  . acetaminophen (TYLENOL) 325 MG tablet   Oral   Take 650 mg by mouth every 6 (six) hours as needed for pain or fever (headache).          . Cyanocobalamin (B-12 PO)   Oral   Take 1 tablet by mouth daily.         . IRON PO   Oral   Take 1 tablet by mouth daily.         Marland Kitchen levETIRAcetam (KEPPRA) 500 MG tablet   Oral   Take 1 tablet (500 mg total) by mouth 2 (two) times daily.   60 tablet   11   . metroNIDAZOLE (FLAGYL) 500 MG tablet   Oral   Take 1 tablet (500 mg total) by mouth 2 (two) times daily.   14 tablet   0   . Multiple Vitamin (MULTIVITAMIN WITH MINERALS) TABS   Oral   Take 1 tablet by mouth daily.         . Naproxen Sodium (ALEVE PO)   Oral   Take 1-2  tablets by mouth 2 times daily at 12 noon and 4 pm.         . sulfamethoxazole-trimethoprim (BACTRIM DS,SEPTRA DS) 800-160 MG per tablet   Oral   Take 1 tablet by mouth 2 (two) times daily.   6 tablet   0    BP 133/100  Pulse 100  Temp(Src) 98.6 F (37 C) (Oral)  Resp 20  SpO2 100%  LMP 11/14/2012 Physical Exam  Nursing note and vitals reviewed. Constitutional: She is oriented to person, place, and time. She appears well-developed and well-nourished.  HENT:  Head: Normocephalic and atraumatic.  Eyes: EOM are normal. Pupils are equal, round, and reactive to light.  Neck: Normal range of motion. Neck supple.  Cardiovascular: Normal rate, regular rhythm and normal heart sounds.   No murmur heard. Pulmonary/Chest: Effort normal and breath sounds normal. No respiratory distress. She has no  wheezes. She has no rales.  Abdominal: Soft. Bowel sounds are normal. She exhibits no distension. There is tenderness. There is no rebound and no guarding.  Mild suprapubic tenderness without rebound or guarding.  Genitourinary: Vaginal discharge found.  White vaginal discharge. No cervical motion tenderness.  Musculoskeletal: Normal range of motion.  Neurological: She is alert and oriented to person, place, and time. No cranial nerve deficit.  Skin: Skin is warm and dry.  Psychiatric: She has a normal mood and affect. Her speech is normal.    ED Course  Procedures (including critical care time) Labs Reviewed  WET PREP, GENITAL - Abnormal; Notable for the following:    Clue Cells Wet Prep HPF POC MODERATE (*)    WBC, Wet Prep HPF POC FEW (*)    All other components within normal limits  URINALYSIS, ROUTINE W REFLEX MICROSCOPIC - Abnormal; Notable for the following:    APPearance TURBID (*)    Hgb urine dipstick LARGE (*)    Protein, ur >300 (*)    Leukocytes, UA LARGE (*)    All other components within normal limits  URINE MICROSCOPIC-ADD ON - Abnormal; Notable for the following:    Squamous Epithelial / LPF FEW (*)    Bacteria, UA MANY (*)    All other components within normal limits  GC/CHLAMYDIA PROBE AMP  URINE CULTURE  PREGNANCY, URINE   No results found. 1. UTI (urinary tract infection)   2. Bacterial vaginosis     MDM   patient with back pain and dysuria. He has UTI and bacterial vaginosis. We'll treat with antibiotics.   Juliet Rude. Rubin Payor, MD 11/18/12 2337

## 2012-11-18 NOTE — ED Notes (Signed)
Pt now requested to have additional note with c/o rectal pain

## 2012-11-18 NOTE — ED Notes (Signed)
Pt c/o lower back pain x 2 weeks-abd pain x 1hour

## 2012-11-19 LAB — GC/CHLAMYDIA PROBE AMP
CT Probe RNA: NEGATIVE
GC Probe RNA: NEGATIVE

## 2012-11-20 LAB — URINE CULTURE: Colony Count: >=100000

## 2012-11-21 ENCOUNTER — Telehealth (HOSPITAL_COMMUNITY): Payer: Self-pay | Admitting: Emergency Medicine

## 2012-11-21 NOTE — ED Notes (Signed)
Post ED Visit - Positive Culture Follow-up  Culture report reviewed by antimicrobial stewardship pharmacist: []  Wes Dulaney, Pharm.D., BCPS []  Celedonio Miyamoto, Pharm.D., BCPS [x]  Georgina Pillion, Pharm.D., BCPS []  [place], [place].D., BCPS, AAHIVP []  Estella Husk, Pharm.D., BCPS, AAHIVP  Positive urine culture Treated with Bactrim, organism sensitive to the same and no further patient follow-up is required at this time.  Kylie A Holland 11/21/2012, 2:01 PM

## 2012-12-09 ENCOUNTER — Encounter: Payer: Self-pay | Admitting: Nurse Practitioner

## 2012-12-09 ENCOUNTER — Ambulatory Visit (INDEPENDENT_AMBULATORY_CARE_PROVIDER_SITE_OTHER): Payer: BC Managed Care – PPO | Admitting: Nurse Practitioner

## 2012-12-09 VITALS — BP 112/61 | HR 75 | Ht 70.5 in | Wt 175.0 lb

## 2012-12-09 DIAGNOSIS — G40309 Generalized idiopathic epilepsy and epileptic syndromes, not intractable, without status epilepticus: Secondary | ICD-10-CM

## 2012-12-09 DIAGNOSIS — G40909 Epilepsy, unspecified, not intractable, without status epilepticus: Secondary | ICD-10-CM

## 2012-12-09 NOTE — Progress Notes (Signed)
HPI: Ms. Sparkman 32 year old right-handed black female with a history of a single seizure event that occurred on 07/14/2012. She was initially evaluated by Dr. Anne Hahn 07/20/2012. The patient was in class at that time, and she felt somewhat confused for several moments prior to the onset of the seizure. The patient was noted to have some right facial distortions, and then she lost consciousness with generalized jerking and tongue biting. The patient did not lose control of the bowels or the bladder. The patient was taken to the emergency room, and she underwent a CT scan of brain and blood work. The patient has a mild iron deficiency anemia, but otherwise the blood work was unremarkable. A urine drug screen was negative. The patient has never had blackout episodes or confusional episodes in the past. The patient was noted to have a seizure event lasting about 12 minutes. The patient denies any prior history of head trauma. The patient has a first cousin with seizures, but this was secondary to meningitis as a child. Otherwise, there is no family history of seizures. MRI of the brain with and without contrast was normal. EEG was abnormal in the left temporal area which would lower her seizure threshold. No epileptiform activity was seen. She was started on Lamictal by Dr. Anne Hahn but had side effects to the drug. She then was placed on Keppra and is currently on 500 mg twice daily. She had a seizure event in May after missing 3 doses of medication. She is currently not driving.     ROS:   Negative except for fatigue, blurred vision, anxiety and decreased energy  Physical Exam General: well developed, well nourished, seated, in no evident distress Head: head normocephalic and atraumatic. Oropharynx benign Neck: supple with no carotid  bruits Cardiovascular: regular rate and rhythm, no murmurs  Neurologic Exam Mental Status: Awake and fully alert. Oriented to place and time. Follows all commands. Speech and  language normal.   Cranial Nerves:  Pupils equal, briskly reactive to light. Extraocular movements full without nystagmus. Visual fields full to confrontation. Hearing intact and symmetric to finger snap.  Face, tongue, palate move normally and symmetrically. Neck flexion and extension normal.  Motor: Normal bulk and tone. Normal strength in all tested extremity muscles.No focal weakness Coordination: Rapid alternating movements normal in all extremities. Finger-to-nose and heel-to-shin performed accurately bilaterally. Gait and Station: Arises from chair without difficulty. Stance is normal. Gait demonstrates normal stride length and balance . Able to heel, toe and tandem walk without difficulty.  Reflexes: 2+ and symmetric. Toes downgoing.     ASSESSMENT: Generalized seizure disorder, MRI of the brain was normal, EEG was abnormal in the left temporal area decreasing her seizure threshold.     PLAN: Continue Keppra at current dose May drive in November Given information on epilepsy Followup in 6 months  Nilda Riggs, [facility] APRN

## 2012-12-09 NOTE — Progress Notes (Signed)
I have read the note, and I agree with the clinical assessment and plan.  

## 2012-12-09 NOTE — Patient Instructions (Addendum)
Continue Keppra at current dose May drive in November Given information on epilepsy Followup in 6 months

## 2012-12-25 ENCOUNTER — Encounter (HOSPITAL_COMMUNITY): Payer: Self-pay | Admitting: Emergency Medicine

## 2012-12-25 ENCOUNTER — Emergency Department (HOSPITAL_COMMUNITY)
Admission: EM | Admit: 2012-12-25 | Discharge: 2012-12-26 | Disposition: A | Discharge status: Home / Self Care | Payer: BC Managed Care – PPO | Attending: Emergency Medicine | Admitting: Emergency Medicine

## 2012-12-25 DIAGNOSIS — Z79899 Other long term (current) drug therapy: Secondary | ICD-10-CM | POA: Insufficient documentation

## 2012-12-25 DIAGNOSIS — Z8742 Personal history of other diseases of the female genital tract: Secondary | ICD-10-CM | POA: Insufficient documentation

## 2012-12-25 DIAGNOSIS — G40909 Epilepsy, unspecified, not intractable, without status epilepticus: Secondary | ICD-10-CM | POA: Insufficient documentation

## 2012-12-25 LAB — POCT I-STAT, CHEM 8
Chloride: 100 mEq/L (ref 96–112)
Creatinine, Ser: 0.8 mg/dL (ref 0.50–1.10)
HCT: 38 % (ref 36.0–46.0)
Hemoglobin: 12.9 g/dL (ref 12.0–15.0)
Potassium: 3.8 mEq/L (ref 3.5–5.1)
Sodium: 139 mEq/L (ref 135–145)

## 2012-12-25 MED ORDER — ONDANSETRON HCL 4 MG/2ML IJ SOLN
4.0000 mg | Freq: Once | INTRAMUSCULAR | Status: AC
  Administered 2012-12-25: 4 mg via INTRAVENOUS
  Filled 2012-12-25: qty 2

## 2012-12-25 MED ORDER — SODIUM CHLORIDE 0.9 % IV SOLN
1000.0000 mg | INTRAVENOUS | Status: AC
  Administered 2012-12-25: 1000 mg via INTRAVENOUS
  Filled 2012-12-25: qty 10

## 2012-12-25 MED ORDER — FENTANYL CITRATE 0.05 MG/ML IJ SOLN
50.0000 ug | Freq: Once | INTRAMUSCULAR | Status: AC
  Administered 2012-12-25: 50 ug via INTRAVENOUS
  Filled 2012-12-25: qty 2

## 2012-12-25 NOTE — ED Notes (Addendum)
Pt to ED Via EMS for seizure episode that lasted for about 2 minutes. Hx of seizure. Pt states she was sitting in a chair and started having seizure, fell forward and hit her to the floor. Hematoma noted at left cheek. Pt c/o headache. Pt denies neck or back pain. Per EMS, pt postictal at arrival to the pt. No urinary incontinent. CBG-90, BP-120/74, HR-90, SpO2-100% on room air, RR-16, pain-8/10 headache. Pt seizure meds level checked x1 week and no changes made. Pt states the last time she had a seizure, she was not taking meds well but taking her meds as prescribed this time. Alert and oriented x4.

## 2012-12-26 ENCOUNTER — Emergency Department (HOSPITAL_COMMUNITY): Payer: BC Managed Care – PPO

## 2012-12-26 LAB — URINALYSIS, ROUTINE W REFLEX MICROSCOPIC
Hgb urine dipstick: NEGATIVE
Specific Gravity, Urine: 1.016 (ref 1.005–1.030)
Urobilinogen, UA: 0.2 mg/dL (ref 0.0–1.0)
pH: 7.5 (ref 5.0–8.0)

## 2012-12-26 LAB — CBC WITH DIFFERENTIAL/PLATELET
Basophils Relative: 0 % (ref 0–1)
HCT: 35.1 % — ABNORMAL LOW (ref 36.0–46.0)
Hemoglobin: 11.8 g/dL — ABNORMAL LOW (ref 12.0–15.0)
Lymphocytes Relative: 23 % (ref 12–46)
Lymphs Abs: 1.6 10*3/uL (ref 0.7–4.0)
MCHC: 33.6 g/dL (ref 30.0–36.0)
Monocytes Relative: 8 % (ref 3–12)
Neutro Abs: 4.7 10*3/uL (ref 1.7–7.7)
Neutrophils Relative %: 68 % (ref 43–77)
RBC: 4.27 MIL/uL (ref 3.87–5.11)
WBC: 7 10*3/uL (ref 4.0–10.5)

## 2012-12-26 MED ORDER — NAPROXEN 375 MG PO TABS: 375.0000 mg | ORAL_TABLET | Freq: Two times a day (BID) | ORAL | Status: DC

## 2012-12-26 NOTE — ED Provider Notes (Signed)
CSN: [id number]     Arrival date & time 12/25/12  2255 History     First MD Initiated Contact with Patient 12/25/12 2312     Chief Complaint  Patient presents with  . Seizures   (Consider location/radiation/quality/duration/timing/severity/associated sxs/prior Treatment) Patient is a 32 y.o. female presenting with seizures. The history is provided by the patient and medical records. The history is limited by the condition of the patient (amnesia).  Seizures Seizure activity on arrival: no   Seizure type:  Grand mal Preceding symptoms: no sensation of an aura present, no headache and no vision change   Initial focality:  None Episode characteristics: no incontinence   Postictal symptoms: no confusion   Return to baseline: yes   Severity:  Moderate Timing:  Once Progression:  Resolved Context: not alcohol withdrawal and not intracranial lesion   Recent head injury:  No recent head injuries PTA treatment:  None History of seizures: yes     Past Medical History  Diagnosis Date  . Cesarean delivery delivered   . Fibrocystic breast disease   . Headache(784.0)     menstrual  . Seizures    Past Surgical History  Procedure Laterality Date  . D & c  2006, 2011  . Hysteroscopy  2004  . Laparoscopy  2004  . Dilation and evacuation  08/16/2011    Procedure: DILATATION AND EVACUATION;  Surgeon: Esmeralda Arthur, MD;  Location: [facility];  Service: Gynecology;  Laterality: N/A;  . Cesarean section    . Dilation and curettage of uterus     Family History  Problem Relation Age of Onset  . Heart attack Father   . Hypertension Maternal Grandmother    History  Substance Use Topics  . Smoking status: Never Smoker   . Smokeless tobacco: Never Used  . Alcohol Use: No   OB History   Grav Para Term Preterm Abortions TAB SAB Ect Mult Living   5 3        1      Review of Systems  Neurological: Positive for seizures. Negative for speech difficulty, weakness and headaches.  All other  systems reviewed and are negative.    Allergies  Aspirin and Lamictal  Home Medications   Current Outpatient Rx  Name  Route  Sig  Dispense  Refill  . ibuprofen (ADVIL,MOTRIN) 200 MG tablet   Oral   Take 400 mg by mouth every 6 (six) hours as needed for pain.         Marland Kitchen levETIRAcetam (KEPPRA) 500 MG tablet   Oral   Take 1 tablet (500 mg total) by mouth 2 (two) times daily.   60 tablet   11   . Naproxen Sodium (ALEVE PO)   Oral   Take 1-2 tablets by mouth 2 (two) times daily as needed. For pain. Can take up to 2 tablets for pain          Pulse 84  Resp 22  SpO2 99% Physical Exam  Constitutional: She is oriented to person, place, and time. She appears well-developed and well-nourished. No distress.  HENT:  Head: Normocephalic and atraumatic.    Right Ear: No mastoid tenderness. No hemotympanum.  Left Ear: No mastoid tenderness. No hemotympanum.  Mouth/Throat: Oropharynx is clear and moist.  Eyes: Conjunctivae and EOM are normal. Pupils are equal, round, and reactive to light.  Neck: Normal range of motion. Neck supple.  Cardiovascular: Normal rate, regular rhythm and intact distal pulses.   Pulmonary/Chest: Effort normal and  breath sounds normal. She has no wheezes. She has no rales.  Abdominal: Soft. Bowel sounds are normal. There is no tenderness. There is no rebound and no guarding.  Musculoskeletal: Normal range of motion. She exhibits no edema.  Neurological: She is alert and oriented to person, place, and time. She has normal reflexes.  Skin: Skin is warm and dry.  Psychiatric: She has a normal mood and affect.    ED Course   Procedures (including critical care time)  Labs Reviewed  CBC WITH DIFFERENTIAL - Abnormal; Notable for the following:    Hemoglobin 11.8 (*)    HCT 35.1 (*)    All other components within normal limits  POCT I-STAT, CHEM 8 - Abnormal; Notable for the following:    Calcium, Ion 1.24 (*)    All other components within normal  limits  URINALYSIS, ROUTINE W REFLEX MICROSCOPIC   Ct Head Wo Contrast  12/26/2012   *RADIOLOGY REPORT*  Clinical Data:  Seizure with fall.  Right side of head hurts.  CT HEAD WITHOUT CONTRAST CT MAXILLOFACIAL WITHOUT CONTRAST  Technique:  Multidetector CT imaging of the head and maxillofacial structures were performed using the standard protocol without intravenous contrast. Multiplanar CT image reconstructions of the maxillofacial structures were also generated.  Comparison:  CT head 09/19/2012.  CT HEAD  Findings: The ventricles and sulci are symmetrical without significant effacement, displacement, or dilatation. No mass effect or midline shift. No abnormal extra-axial fluid collections. The grey-white matter junction is distinct. Basal cisterns are not effaced. No acute intracranial hemorrhage. No depressed skull fractures.  Mastoid air cells are not opacified.  IMPRESSION: No acute intracranial abnormalities.  CT MAXILLOFACIAL  Findings:   The globes and extraocular muscles appear intact and symmetrical.  Lateral grams are symmetrical.  Mild mucosal thickening in the right maxillary antrum.  Small retention cyst in the right sphenoid sinus.  Paranasal sinuses are otherwise clear. No acute air-fluid levels are demonstrated.  The frontal bones, nasal bones, nasal septum, nasal spine, orbital rims, maxillary antral walls, zygomatic arches, pterygoid plates, maxilla, temporomandibular joints, and mandibles appear intact.  No displaced fractures are identified.  IMPRESSION: Mild inflammatory changes in the paranasal sinuses.  No displaced orbital or facial fractures are demonstrated.   Original Report Authenticated By: Burman Nieves, M.D.   Ct Maxillofacial Wo Cm  12/26/2012   *RADIOLOGY REPORT*  Clinical Data:  Seizure with fall.  Right side of head hurts.  CT HEAD WITHOUT CONTRAST CT MAXILLOFACIAL WITHOUT CONTRAST  Technique:  Multidetector CT imaging of the head and maxillofacial structures were  performed using the standard protocol without intravenous contrast. Multiplanar CT image reconstructions of the maxillofacial structures were also generated.  Comparison:  CT head 09/19/2012.  CT HEAD  Findings: The ventricles and sulci are symmetrical without significant effacement, displacement, or dilatation. No mass effect or midline shift. No abnormal extra-axial fluid collections. The grey-white matter junction is distinct. Basal cisterns are not effaced. No acute intracranial hemorrhage. No depressed skull fractures.  Mastoid air cells are not opacified.  IMPRESSION: No acute intracranial abnormalities.  CT MAXILLOFACIAL  Findings:   The globes and extraocular muscles appear intact and symmetrical.  Lateral grams are symmetrical.  Mild mucosal thickening in the right maxillary antrum.  Small retention cyst in the right sphenoid sinus.  Paranasal sinuses are otherwise clear. No acute air-fluid levels are demonstrated.  The frontal bones, nasal bones, nasal septum, nasal spine, orbital rims, maxillary antral walls, zygomatic arches, pterygoid plates, maxilla, temporomandibular joints, and  mandibles appear intact.  No displaced fractures are identified.  IMPRESSION: Mild inflammatory changes in the paranasal sinuses.  No displaced orbital or facial fractures are demonstrated.   Original Report Authenticated By: Burman Nieves, M.D.   No diagnosis found.  MDM  Patient counseled no driving or operating machinery until seizure free for 6 months or cleared by neurology.  Follow up with neurology this week for ongoing care.  Patient and husband verbalize understanding and agree to follow up  Garfield Coiner Smitty Cords, MD 12/26/12 [phone]

## 2012-12-28 ENCOUNTER — Telehealth: Payer: Self-pay | Admitting: Neurology

## 2012-12-28 MED ORDER — LEVETIRACETAM 500 MG PO TABS: ORAL_TABLET | ORAL | Status: DC

## 2012-12-28 NOTE — Telephone Encounter (Signed)
Called and spoke to patient she states she is having a seizure once every couple of Months. Patient had to go to ER on Friday. Patient states she is taking her Keppra 500 mg. Two times daily. Patient states she has not missed a pill. Eber Jones please advise me. 06-11-2013.

## 2012-12-28 NOTE — Telephone Encounter (Signed)
Please have patient increase Keppra to 1 cap in the am, 2 at hs for total dose of 1500mg , increase by 500mg  from current. No driving for 6 months from this seizure. Will renew meds, already faxed increase to pharmacy

## 2012-12-28 NOTE — Telephone Encounter (Signed)
Called patient back at [phone]. Told her Eber Jones was going to increase her Keppra by one capsule. In the am 1500 mg from 500 mg. Still no driving for six months. Patient stated she understood. Patient states I might need a letter for work. I told patient that was fine $20.00 charge and 10-14 day turn around time patient will call back and let us no about letter.

## 2013-01-12 ENCOUNTER — Other Ambulatory Visit: Payer: Self-pay | Admitting: Internal Medicine

## 2013-01-12 DIAGNOSIS — N63 Unspecified lump in unspecified breast: Secondary | ICD-10-CM

## 2013-02-11 ENCOUNTER — Other Ambulatory Visit: Payer: BC Managed Care – PPO

## 2013-03-09 ENCOUNTER — Ambulatory Visit
Admission: RE | Admit: 2013-03-09 | Discharge: 2013-03-09 | Disposition: A | Discharge status: Home / Self Care | Payer: Federal, State, Local not specified - PPO | Source: Ambulatory Visit | Attending: Internal Medicine | Admitting: Internal Medicine

## 2013-03-09 DIAGNOSIS — N63 Unspecified lump in unspecified breast: Secondary | ICD-10-CM

## 2013-04-12 ENCOUNTER — Encounter: Payer: Self-pay | Admitting: Nurse Practitioner

## 2013-05-06 HISTORY — PX: CERVICAL ABLATION: SHX5771

## 2013-06-11 ENCOUNTER — Ambulatory Visit: Payer: BC Managed Care – PPO | Admitting: Nurse Practitioner

## 2013-09-27 ENCOUNTER — Emergency Department (HOSPITAL_BASED_OUTPATIENT_CLINIC_OR_DEPARTMENT_OTHER)
Admission: EM | Admit: 2013-09-27 | Discharge: 2013-09-27 | Disposition: A | Discharge status: Home / Self Care | Payer: BC Managed Care – PPO | Attending: Emergency Medicine | Admitting: Emergency Medicine

## 2013-09-27 ENCOUNTER — Encounter (HOSPITAL_BASED_OUTPATIENT_CLINIC_OR_DEPARTMENT_OTHER): Payer: Self-pay | Admitting: Emergency Medicine

## 2013-09-27 DIAGNOSIS — G40909 Epilepsy, unspecified, not intractable, without status epilepticus: Secondary | ICD-10-CM | POA: Insufficient documentation

## 2013-09-27 DIAGNOSIS — Z791 Long term (current) use of non-steroidal anti-inflammatories (NSAID): Secondary | ICD-10-CM | POA: Insufficient documentation

## 2013-09-27 DIAGNOSIS — H6690 Otitis media, unspecified, unspecified ear: Secondary | ICD-10-CM | POA: Insufficient documentation

## 2013-09-27 DIAGNOSIS — Z8742 Personal history of other diseases of the female genital tract: Secondary | ICD-10-CM | POA: Insufficient documentation

## 2013-09-27 DIAGNOSIS — Z79899 Other long term (current) drug therapy: Secondary | ICD-10-CM | POA: Insufficient documentation

## 2013-09-27 MED ORDER — AMOXICILLIN 500 MG PO CAPS: 500.0000 mg | ORAL_CAPSULE | Freq: Three times a day (TID) | ORAL | Status: AC

## 2013-09-27 NOTE — ED Provider Notes (Signed)
CSN: [id number]     Arrival date & time 09/27/13  1106 History   First MD Initiated Contact with Patient 09/27/13 1213     Chief Complaint  Patient presents with  . Otalgia     (Consider location/radiation/quality/duration/timing/severity/associated sxs/prior Treatment) Patient is a 33 y.o. female presenting with ear pain. The history is provided by the patient. No language interpreter was used.  Otalgia Location:  Left Quality:  Aching Severity:  Moderate Onset quality:  Gradual Duration:  2 weeks Timing:  Constant Progression:  Worsening Chronicity:  New Relieved by:  Nothing Ineffective treatments:  None tried Associated symptoms: neck pain   Associated symptoms: no rhinorrhea and no sore throat     Past Medical History  Diagnosis Date  . Cesarean delivery delivered   . Fibrocystic breast disease   . Headache(784.0)     menstrual  . Seizures    Past Surgical History  Procedure Laterality Date  . D & c  2006, 2011  . Hysteroscopy  2004  . Laparoscopy  2004  . Dilation and evacuation  08/16/2011    Procedure: DILATATION AND EVACUATION;  Surgeon: Alwyn Pea, MD;  Location: [facility];  Service: Gynecology;  Laterality: N/A;  . Cesarean section    . Dilation and curettage of uterus     Family History  Problem Relation Age of Onset  . Heart attack Father   . Hypertension Maternal Grandmother    History  Substance Use Topics  . Smoking status: Never Smoker   . Smokeless tobacco: Never Used  . Alcohol Use: No   OB History   Grav Para Term Preterm Abortions TAB SAB Ect Mult Living   5 3        1      Review of Systems  HENT: Positive for ear pain. Negative for rhinorrhea and sore throat.   Musculoskeletal: Positive for neck pain.  All other systems reviewed and are negative.     Allergies  Aspirin and Lamictal  Home Medications   Prior to Admission medications   Medication Sig Start Date End Date Taking? Authorizing Provider  carbamazepine  (CARBATROL) 300 MG 12 hr capsule Take 300 mg by mouth 2 (two) times daily.   Yes Historical Provider, MD  ibuprofen (ADVIL,MOTRIN) 200 MG tablet Take 400 mg by mouth every 6 (six) hours as needed for pain.    Historical Provider, MD  levETIRAcetam (KEPPRA) 500 MG tablet 1 in the am, 2 at hs 12/28/12   Dennie Bible, NP  naproxen (NAPROSYN) 375 MG tablet Take 1 tablet (375 mg total) by mouth 2 (two) times daily. 12/26/12   April K Palumbo-Rasch, MD  Naproxen Sodium (ALEVE PO) Take 1-2 tablets by mouth 2 (two) times daily as needed. For pain. Can take up to 2 tablets for pain    Historical Provider, MD   BP 111/67  Pulse 82  Temp(Src) 97.7 F (36.5 C) (Oral)  Resp 16  Ht 5\' 10"  (1.778 m)  Wt 182 lb (82.555 kg)  BMI 26.11 kg/m2  SpO2 100%  LMP 09/03/2013 Physical Exam  Nursing note and vitals reviewed. Constitutional: She appears well-developed and well-nourished.  HENT:  Head: Normocephalic.  Left tm bulging,   No erythema,   Tender ac lymph nodes  Eyes: Conjunctivae are normal. Pupils are equal, round, and reactive to light.  Neck: Normal range of motion. Neck supple.  Cardiovascular: Normal rate and regular rhythm.   Pulmonary/Chest: Effort normal.  Abdominal: Soft.  Musculoskeletal: Normal range  of motion.  Neurological: She is alert.  Skin: Skin is warm.    ED Course  Procedures (including critical care time) Labs Review Labs Reviewed - No data to display  Imaging Review No results found.   EKG Interpretation None      MDM   Final diagnoses:  None    [place], PA-C 09/27/13 1258

## 2013-09-27 NOTE — ED Notes (Signed)
Disregard previous charting- charted on incorrect pt.

## 2013-09-27 NOTE — ED Notes (Signed)
Left ear pain x 1.5 weeks.  Describes pain as pressure and a headache.

## 2013-09-27 NOTE — Discharge Instructions (Signed)
Otitis Media, Adult Otitis media is redness, soreness, and swelling (inflammation) of the middle ear. Otitis media may be caused by allergies or, most commonly, by infection. Often it occurs as a complication of the common cold. SIGNS AND SYMPTOMS Symptoms of otitis media may include:  Earache.  Fever.  Ringing in your ear.  Headache.  Leakage of fluid from the ear. DIAGNOSIS To diagnose otitis media, your health care provider will examine your ear with an otoscope. This is an instrument that allows your health care provider to see into your ear in order to examine your eardrum. Your health care provider also will ask you questions about your symptoms. TREATMENT  Typically, otitis media resolves on its own within 3 5 days. Your health care provider may prescribe medicine to ease your symptoms of pain. If otitis media does not resolve within 5 days or is recurrent, your health care provider may prescribe antibiotic medicines if he or she suspects that a bacterial infection is the cause. HOME CARE INSTRUCTIONS   Take your medicine as directed until it is gone, even if you feel better after the first few days.  Only take over-the-counter or prescription medicines for pain, discomfort, or fever as directed by your health care provider.  Follow up with your health care provider as directed. SEEK MEDICAL CARE IF:  You have otitis media only in one ear or bleeding from your nose or both.  You notice a lump on your neck.  You are not getting better in 3 5 days.  You feel worse instead of better. SEEK IMMEDIATE MEDICAL CARE IF:   You have pain that is not controlled with medicine.  You have swelling, redness, or pain around your ear or stiffness in your neck.  You notice that part of your face is paralyzed.  You notice that the bone behind your ear (mastoid) is tender when you touch it. MAKE SURE YOU:   Understand these instructions.  Will watch your condition.  Will get help  right away if you are not doing well or get worse. Document Released: 01/26/2004 Document Revised: 02/10/2013 Document Reviewed: 11/17/2012 [employer] Patient Information 2014 [employer].  

## 2013-09-29 NOTE — ED Provider Notes (Signed)
Medical screening examination/treatment/procedure(s) were performed by non-physician practitioner and as supervising physician I was immediately available for consultation/collaboration.   EKG Interpretation None        Tanna Furry, MD 09/29/13 [phone]

## 2013-11-15 ENCOUNTER — Other Ambulatory Visit: Payer: Self-pay | Admitting: Internal Medicine

## 2013-11-15 DIAGNOSIS — N63 Unspecified lump in unspecified breast: Secondary | ICD-10-CM

## 2013-11-22 ENCOUNTER — Ambulatory Visit
Admission: RE | Admit: 2013-11-22 | Discharge: 2013-11-22 | Disposition: A | Discharge status: Home / Self Care | Payer: BC Managed Care – PPO | Source: Ambulatory Visit | Attending: Internal Medicine | Admitting: Internal Medicine

## 2013-11-22 DIAGNOSIS — N63 Unspecified lump in unspecified breast: Secondary | ICD-10-CM

## 2014-01-27 ENCOUNTER — Other Ambulatory Visit: Payer: Self-pay | Admitting: Obstetrics and Gynecology

## 2014-03-01 ENCOUNTER — Encounter (HOSPITAL_BASED_OUTPATIENT_CLINIC_OR_DEPARTMENT_OTHER): Payer: Self-pay | Admitting: Emergency Medicine

## 2014-03-01 ENCOUNTER — Emergency Department (HOSPITAL_BASED_OUTPATIENT_CLINIC_OR_DEPARTMENT_OTHER)
Admission: EM | Admit: 2014-03-01 | Discharge: 2014-03-01 | Disposition: A | Discharge status: Home / Self Care | Payer: BC Managed Care – PPO | Attending: Emergency Medicine | Admitting: Emergency Medicine

## 2014-03-01 ENCOUNTER — Emergency Department (HOSPITAL_BASED_OUTPATIENT_CLINIC_OR_DEPARTMENT_OTHER): Payer: BC Managed Care – PPO

## 2014-03-01 DIAGNOSIS — Z87448 Personal history of other diseases of urinary system: Secondary | ICD-10-CM | POA: Diagnosis not present

## 2014-03-01 DIAGNOSIS — R0602 Shortness of breath: Secondary | ICD-10-CM | POA: Insufficient documentation

## 2014-03-01 DIAGNOSIS — R0789 Other chest pain: Secondary | ICD-10-CM | POA: Diagnosis not present

## 2014-03-01 DIAGNOSIS — R079 Chest pain, unspecified: Secondary | ICD-10-CM | POA: Diagnosis present

## 2014-03-01 DIAGNOSIS — Z79899 Other long term (current) drug therapy: Secondary | ICD-10-CM | POA: Insufficient documentation

## 2014-03-01 DIAGNOSIS — Z791 Long term (current) use of non-steroidal anti-inflammatories (NSAID): Secondary | ICD-10-CM | POA: Diagnosis not present

## 2014-03-01 LAB — COMPREHENSIVE METABOLIC PANEL
ALBUMIN: 3.8 g/dL (ref 3.5–5.2)
ALT: 11 U/L (ref 0–35)
ANION GAP: 11 (ref 5–15)
AST: 14 U/L (ref 0–37)
Alkaline Phosphatase: 86 U/L (ref 39–117)
BUN: 11 mg/dL (ref 6–23)
CALCIUM: 9.4 mg/dL (ref 8.4–10.5)
CHLORIDE: 100 meq/L (ref 96–112)
CO2: 27 mEq/L (ref 19–32)
CREATININE: 0.6 mg/dL (ref 0.50–1.10)
GFR calc Af Amer: >90 mL/min (ref >90)
GFR calc non Af Amer: >90 mL/min (ref >90)
Glucose, Bld: 93 mg/dL (ref 70–99)
Potassium: 4.3 mEq/L (ref 3.7–5.3)
Sodium: 138 mEq/L (ref 137–147)
TOTAL PROTEIN: 7.4 g/dL (ref 6.0–8.3)
Total Bilirubin: <0.2 mg/dL — ABNORMAL LOW (ref 0.3–1.2)

## 2014-03-01 LAB — CBC WITH DIFFERENTIAL/PLATELET
BASOS ABS: 0 10*3/uL (ref 0.0–0.1)
BASOS PCT: 1 % (ref 0–1)
EOS ABS: 0.1 10*3/uL (ref 0.0–0.7)
EOS PCT: 2 % (ref 0–5)
HEMATOCRIT: 33.9 % — AB (ref 36.0–46.0)
HEMOGLOBIN: 11.4 g/dL — AB (ref 12.0–15.0)
Lymphocytes Relative: 36 % (ref 12–46)
Lymphs Abs: 1.9 10*3/uL (ref 0.7–4.0)
MCH: 28.5 pg (ref 26.0–34.0)
MCHC: 33.6 g/dL (ref 30.0–36.0)
MCV: 84.8 fL (ref 78.0–100.0)
MONO ABS: 0.5 10*3/uL (ref 0.1–1.0)
MONOS PCT: 10 % (ref 3–12)
NEUTROS ABS: 2.7 10*3/uL (ref 1.7–7.7)
Neutrophils Relative %: 51 % (ref 43–77)
Platelets: 333 10*3/uL (ref 150–400)
RBC: 4 MIL/uL (ref 3.87–5.11)
RDW: 12.2 % (ref 11.5–15.5)
WBC: 5.2 10*3/uL (ref 4.0–10.5)

## 2014-03-01 LAB — D-DIMER, QUANTITATIVE (NOT AT ARMC)

## 2014-03-01 LAB — TROPONIN I

## 2014-03-01 MED ORDER — ACETAMINOPHEN 325 MG PO TABS
650.0000 mg | ORAL_TABLET | Freq: Once | ORAL | Status: AC
  Administered 2014-03-01: 650 mg via ORAL
  Filled 2014-03-01: qty 2

## 2014-03-01 NOTE — ED Provider Notes (Signed)
CSN: [id number]     Arrival date & time 03/01/14  1916 History  This chart was scribed for Pamella Pert, MD by Molli Posey, ED Scribe. This patient was seen in room MH10/MH10 and the patient's care was started 8:35 PM.      Chief Complaint  Patient presents with  . Chest Pain   Patient is a 33 y.o. female presenting with chest pain. The history is provided by the patient. No language interpreter was used.  Chest Pain Pain quality: dull and sharp   Pain radiates to the back: no   Pain severity:  Mild Duration:  4 days Timing:  Intermittent Progression:  Worsening Chronicity:  New Associated symptoms: shortness of breath   Associated symptoms: no abdominal pain, no back pain, no cough, no fatigue and no headache    HPI Comments: Paige Dunn is a 33 y.o. female who presents to the Emergency Department complaining of constant, dull chest pressure for the past 4 days. Pt reports she woke up with this pressure 4 days ago. Pt rates her pain is a 5/10 currently. Pt reports intermittent sharp chest pain that would radiate down her legs with associated SOB that started today. Pt reports deep breathing worsens her pain. Pt reports she just stared a new birth control medication 3 weeks ago because she has been having issues with her menstrual cycle. She denies recent surgery, and a history of CA.    PCP SANDERS   Past Medical History  Diagnosis Date  . Cesarean delivery delivered   . Fibrocystic breast disease   . Headache(784.0)     menstrual  . Seizures    Past Surgical History  Procedure Laterality Date  . D & c  2006, 2011  . Hysteroscopy  2004  . Laparoscopy  2004  . Dilation and evacuation  08/16/2011    Procedure: DILATATION AND EVACUATION;  Surgeon: Alwyn Pea, MD;  Location: [facility];  Service: Gynecology;  Laterality: N/A;  . Cesarean section    . Dilation and curettage of uterus     Family History  Problem Relation Age of Onset  . Heart attack Father   .  Hypertension Maternal Grandmother    History  Substance Use Topics  . Smoking status: Never Smoker   . Smokeless tobacco: Never Used  . Alcohol Use: No   OB History   Grav Para Term Preterm Abortions TAB SAB Ect Mult Living   5 3        1      Review of Systems  Constitutional: Negative for appetite change and fatigue.  HENT: Negative for congestion, ear discharge and sinus pressure.   Eyes: Negative for discharge.  Respiratory: Positive for shortness of breath. Negative for cough.   Cardiovascular: Positive for chest pain.  Gastrointestinal: Negative for abdominal pain and diarrhea.  Genitourinary: Negative for frequency and hematuria.  Musculoskeletal: Negative for back pain.  Skin: Negative for rash.  Neurological: Negative for seizures and headaches.  Psychiatric/Behavioral: Negative for hallucinations.  All other systems reviewed and are negative.   Allergies  Aspirin and Lamictal  Home Medications   Prior to Admission medications   Medication Sig Start Date End Date Taking? Authorizing Provider  ESTROGENS CONJ SYNTHETIC A PO Take by mouth.   Yes Historical Provider, MD  carbamazepine (CARBATROL) 300 MG 12 hr capsule Take 300 mg by mouth 2 (two) times daily.    Historical Provider, MD  ibuprofen (ADVIL,MOTRIN) 200 MG tablet Take 400 mg by mouth  every 6 (six) hours as needed for pain.    Historical Provider, MD  levETIRAcetam (KEPPRA) 500 MG tablet 1 in the am, 2 at hs 12/28/12   Dennie Bible, NP  naproxen (NAPROSYN) 375 MG tablet Take 1 tablet (375 mg total) by mouth 2 (two) times daily. 12/26/12   April K Palumbo-Rasch, MD  Naproxen Sodium (ALEVE PO) Take 1-2 tablets by mouth 2 (two) times daily as needed. For pain. Can take up to 2 tablets for pain    Historical Provider, MD   BP 116/77  Pulse 80  Temp(Src) 98.9 F (37.2 C) (Oral)  Resp 18  Ht 5' 10.5" (1.791 m)  Wt 185 lb (83.915 kg)  BMI 26.16 kg/m2  SpO2 100%  LMP 02/08/2014 Physical Exam  Nursing  note and vitals reviewed. Constitutional: She is oriented to person, place, and time. She appears well-developed and well-nourished.  HENT:  Head: Normocephalic and atraumatic.  Eyes: Conjunctivae and EOM are normal. Pupils are equal, round, and reactive to light. No scleral icterus.  Neck: Neck supple. No thyromegaly present.  Cardiovascular: Normal rate and regular rhythm.  Exam reveals no gallop and no friction rub.   No murmur heard. Pulmonary/Chest: Effort normal and breath sounds normal. No stridor. She has no wheezes. She has no rales. She exhibits no tenderness.  Abdominal: She exhibits no distension. There is no tenderness. There is no rebound.  Musculoskeletal: Normal range of motion. She exhibits no edema.  Symmetric appearing LE's without tenderness   Lymphadenopathy:    She has no cervical adenopathy.  Neurological: She is alert and oriented to person, place, and time. She exhibits normal muscle tone. Coordination normal.  Skin: Skin is warm and dry. No rash noted. No erythema.  Psychiatric: She has a normal mood and affect. Her behavior is normal.    ED Course  Procedures DIAGNOSTIC STUDIES: Oxygen Saturation is 100% on RA, normal by my interpretation.    COORDINATION OF CARE: 8:51 PM Discussed treatment plan with pt at bedside and pt agreed to plan.   Labs Review Labs Reviewed  CBC WITH DIFFERENTIAL - Abnormal; Notable for the following:    Hemoglobin 11.4 (*)    HCT 33.9 (*)    All other components within normal limits  COMPREHENSIVE METABOLIC PANEL - Abnormal; Notable for the following:    Total Bilirubin <0.2 (*)    All other components within normal limits  TROPONIN I  D-DIMER, QUANTITATIVE    Imaging Review Dg Chest 2 View  03/01/2014   CLINICAL DATA:  Right-sided chest pain with shortness of Breath  EXAM: CHEST  2 VIEW  COMPARISON:  09/19/2012  FINDINGS: The heart size and mediastinal contours are within normal limits. Both lungs are clear. The  visualized skeletal structures are unremarkable.  IMPRESSION: No active cardiopulmonary disease.   Electronically Signed   By: Inez Catalina M.D.   On: 03/01/2014 21:06     EKG Interpretation   Date/Time:  Tuesday March 01 2014 19:33:38 EDT Ventricular Rate:  70 PR Interval:  180 QRS Duration: 88 QT Interval:  388 QTC Calculation: 419 R Axis:   69 Text Interpretation:  Normal sinus rhythm Normal ECG No significant change  since last tracing Confirmed by Donterius Filley  MD, Antwoine Zorn ([id number]) on 03/01/2014  7:33:02 PM      MDM   Final diagnoses:  SOB (shortness of breath)  Other chest pain   10:48 PM 33 y.o. female who presents with constant chest pressure over the last 4  days. Only risk factor for heart disease his family history. She states that she did start birth control recently. Atypical in that pain is constant. Will get screening lab work and d-dimer as she is low risk [place].  10:49 PM: I interpreted/reviewed the labs and/or imaging which were non-contributory.  Low risk for MACE per HEART score. Feeling moderately better after tylenol.  I have discussed the diagnosis/risks/treatment options with the patient and believe the pt to be eligible for discharge home to follow-up with her pcp. We also discussed returning to the ED immediately if new or worsening sx occur. We discussed the sx which are most concerning (e.g., worsening pain/sob, fever) that necessitate immediate return. Medications administered to the patient during their visit and any new prescriptions provided to the patient are listed below.  Medications given during this visit Medications  acetaminophen (TYLENOL) tablet 650 mg (650 mg Oral Given 03/01/14 2109)    New Prescriptions   No medications on file       I personally performed the services described in this documentation, which was scribed in my presence. The recorded information has been reviewed and is accurate.      Pamella Pert, MD 03/01/14  2250

## 2014-03-01 NOTE — Discharge Instructions (Signed)

## 2014-03-01 NOTE — ED Notes (Signed)
Pressure in her chest x 2 days. Sharp pain today.

## 2014-03-07 ENCOUNTER — Encounter (HOSPITAL_BASED_OUTPATIENT_CLINIC_OR_DEPARTMENT_OTHER): Payer: Self-pay | Admitting: Emergency Medicine

## 2014-04-11 ENCOUNTER — Emergency Department (HOSPITAL_COMMUNITY)
Admission: EM | Admit: 2014-04-11 | Discharge: 2014-04-12 | Disposition: A | Discharge status: Home / Self Care | Payer: BC Managed Care – PPO | Attending: Emergency Medicine | Admitting: Emergency Medicine

## 2014-04-11 ENCOUNTER — Emergency Department (HOSPITAL_COMMUNITY): Payer: BC Managed Care – PPO

## 2014-04-11 ENCOUNTER — Encounter (HOSPITAL_COMMUNITY): Payer: Self-pay | Admitting: *Deleted

## 2014-04-11 DIAGNOSIS — G40909 Epilepsy, unspecified, not intractable, without status epilepticus: Secondary | ICD-10-CM | POA: Insufficient documentation

## 2014-04-11 DIAGNOSIS — N390 Urinary tract infection, site not specified: Secondary | ICD-10-CM | POA: Diagnosis not present

## 2014-04-11 DIAGNOSIS — R109 Unspecified abdominal pain: Secondary | ICD-10-CM | POA: Diagnosis present

## 2014-04-11 DIAGNOSIS — Z8742 Personal history of other diseases of the female genital tract: Secondary | ICD-10-CM | POA: Diagnosis not present

## 2014-04-11 DIAGNOSIS — R079 Chest pain, unspecified: Secondary | ICD-10-CM | POA: Insufficient documentation

## 2014-04-11 DIAGNOSIS — Z791 Long term (current) use of non-steroidal anti-inflammatories (NSAID): Secondary | ICD-10-CM | POA: Insufficient documentation

## 2014-04-11 DIAGNOSIS — Z3202 Encounter for pregnancy test, result negative: Secondary | ICD-10-CM | POA: Diagnosis not present

## 2014-04-11 DIAGNOSIS — Z9889 Other specified postprocedural states: Secondary | ICD-10-CM | POA: Diagnosis not present

## 2014-04-11 DIAGNOSIS — Z79899 Other long term (current) drug therapy: Secondary | ICD-10-CM | POA: Diagnosis not present

## 2014-04-11 DIAGNOSIS — R112 Nausea with vomiting, unspecified: Secondary | ICD-10-CM | POA: Insufficient documentation

## 2014-04-11 DIAGNOSIS — M79605 Pain in left leg: Secondary | ICD-10-CM | POA: Diagnosis not present

## 2014-04-11 LAB — URINALYSIS, ROUTINE W REFLEX MICROSCOPIC
BILIRUBIN URINE: NEGATIVE
GLUCOSE, UA: NEGATIVE mg/dL
Ketones, ur: NEGATIVE mg/dL
Nitrite: NEGATIVE
PH: 7 (ref 5.0–8.0)
PROTEIN: 100 mg/dL — AB
Specific Gravity, Urine: 1.015 (ref 1.005–1.030)
Urobilinogen, UA: 1 mg/dL (ref 0.0–1.0)

## 2014-04-11 LAB — CBC
HEMATOCRIT: 35.5 % — AB (ref 36.0–46.0)
Hemoglobin: 11.9 g/dL — ABNORMAL LOW (ref 12.0–15.0)
MCH: 27.4 pg (ref 26.0–34.0)
MCHC: 33.5 g/dL (ref 30.0–36.0)
MCV: 81.8 fL (ref 78.0–100.0)
PLATELETS: 301 10*3/uL (ref 150–400)
RBC: 4.34 MIL/uL (ref 3.87–5.11)
RDW: 12.4 % (ref 11.5–15.5)
WBC: 7 10*3/uL (ref 4.0–10.5)

## 2014-04-11 LAB — URINE MICROSCOPIC-ADD ON

## 2014-04-11 LAB — LIPASE, BLOOD: Lipase: 21 U/L (ref 11–59)

## 2014-04-11 LAB — BASIC METABOLIC PANEL
ANION GAP: 13 (ref 5–15)
BUN: 12 mg/dL (ref 6–23)
CALCIUM: 9.2 mg/dL (ref 8.4–10.5)
CHLORIDE: 102 meq/L (ref 96–112)
CO2: 24 mEq/L (ref 19–32)
CREATININE: 0.73 mg/dL (ref 0.50–1.10)
GFR calc Af Amer: >90 mL/min (ref >90)
Glucose, Bld: 99 mg/dL (ref 70–99)
Potassium: 3.9 mEq/L (ref 3.7–5.3)
Sodium: 139 mEq/L (ref 137–147)

## 2014-04-11 LAB — I-STAT TROPONIN, ED: Troponin i, poc: 0 ng/mL (ref 0.00–0.08)

## 2014-04-11 LAB — PREGNANCY, URINE: Preg Test, Ur: NEGATIVE

## 2014-04-11 MED ORDER — SODIUM CHLORIDE 0.9 % IV BOLUS (SEPSIS)
1000.0000 mL | Freq: Once | INTRAVENOUS | Status: AC
  Administered 2014-04-11: 1000 mL via INTRAVENOUS

## 2014-04-11 MED ORDER — MORPHINE SULFATE 4 MG/ML IJ SOLN
4.0000 mg | Freq: Once | INTRAMUSCULAR | Status: AC
  Administered 2014-04-11: 4 mg via INTRAVENOUS
  Filled 2014-04-11: qty 1

## 2014-04-11 MED ORDER — IOHEXOL 300 MG/ML  SOLN
100.0000 mL | Freq: Once | INTRAMUSCULAR | Status: AC | PRN
  Administered 2014-04-11: 100 mL via INTRAVENOUS

## 2014-04-11 MED ORDER — ONDANSETRON HCL 4 MG/2ML IJ SOLN
4.0000 mg | Freq: Once | INTRAMUSCULAR | Status: AC
  Administered 2014-04-11: 4 mg via INTRAVENOUS
  Filled 2014-04-11: qty 2

## 2014-04-11 NOTE — ED Notes (Addendum)
Pt in c/o abd pain that started today and left flank pain, also reports chest pain that has been intermittent for weeks, states the pain on her left side shoots into her chest, flank, and leg, also n/v, pt hyperventilating in triage and actively vomiting

## 2014-04-11 NOTE — ED Notes (Signed)
CT notified that the pt has finished her contrast.  

## 2014-04-11 NOTE — ED Provider Notes (Signed)
CSN: [id number]     Arrival date & time 04/11/14  2102 History   First MD Initiated Contact with Patient 04/11/14 2133     Chief Complaint  Patient presents with  . Abdominal Pain  . Chest Pain     (Consider location/radiation/quality/duration/timing/severity/associated sxs/prior Treatment) HPI Paige Dunn is a 33 y.o. female with a history of C-section, D&C 2 comes in for evaluation of abdominal pain and chest pain. Patient states this morning at 7 AM she felt like she had an upset stomach" with nausea and rumbling" so she tried to relieve it with mints and antacids, but did not experience relief. She reports nausea and vomiting "all day". She reports this evening at 7:30 PM she began to experience shooting pains from her left leg through her abdomen into her chest. She characterizes this as a spasm and it feels like an electrical shock. She reports urinary hesitancy and frequency, but no overt dysuria or hematuria. Denies vaginal bleeding, discharge. She reports her chest pain is similar to the pain she was evaluated for last time she was in ED. She denies fevers, shortness of breath, flank pain, numbness or weakness. No history of nephrolithiasis  Past Medical History  Diagnosis Date  . Cesarean delivery delivered   . Fibrocystic breast disease   . Headache(784.0)     menstrual  . Seizures    Past Surgical History  Procedure Laterality Date  . D & c  2006, 2011  . Hysteroscopy  2004  . Laparoscopy  2004  . Dilation and evacuation  08/16/2011    Procedure: DILATATION AND EVACUATION;  Surgeon: Alwyn Pea, MD;  Location: [facility];  Service: Gynecology;  Laterality: N/A;  . Cesarean section    . Dilation and curettage of uterus     Family History  Problem Relation Age of Onset  . Heart attack Father   . Hypertension Maternal Grandmother    History  Substance Use Topics  . Smoking status: Never Smoker   . Smokeless tobacco: Never Used  . Alcohol Use: No   OB History    Gravida Para Term Preterm AB TAB SAB Ectopic Multiple Living   5 3        1      Review of Systems  Constitutional: Negative for fever.  HENT: Negative for sore throat.   Eyes: Negative for visual disturbance.  Respiratory: Negative for shortness of breath.   Cardiovascular: Positive for chest pain.  Gastrointestinal: Positive for nausea, vomiting and abdominal pain.  Endocrine: Negative for polyuria.  Genitourinary: Negative for dysuria, vaginal bleeding, vaginal discharge and vaginal pain.  Skin: Negative for rash.  Neurological: Negative for headaches.      Allergies  Aspirin and Lamictal  Home Medications   Prior to Admission medications   Medication Sig Start Date End Date Taking? Authorizing Provider  carbamazepine (CARBATROL) 300 MG 12 hr capsule Take 300 mg by mouth 2 (two) times daily.   Yes Historical Provider, MD  ESTROGENS CONJ SYNTHETIC A PO Take 1 tablet by mouth daily.    Yes Historical Provider, MD  ibuprofen (ADVIL,MOTRIN) 200 MG tablet Take 400 mg by mouth every 6 (six) hours as needed for pain.   Yes Historical Provider, MD  cephALEXin (KEFLEX) 500 MG capsule Take 1 capsule (500 mg total) by mouth 4 (four) times daily. 04/12/14   Verl Dicker, PA-C  levETIRAcetam (KEPPRA) 500 MG tablet 1 in the am, 2 at hs Patient not taking: Reported on 04/11/2014 12/28/12  Dennie Bible, NP  naproxen (NAPROSYN) 375 MG tablet Take 1 tablet (375 mg total) by mouth 2 (two) times daily. Patient not taking: Reported on 04/11/2014 12/26/12   April K Palumbo-Rasch, MD   BP 112/59 mmHg  Pulse 92  Temp(Src) 98.4 F (36.9 C) (Oral)  Resp 18  Ht 5\' 10"  (1.778 m)  Wt 191 lb (86.637 kg)  BMI 27.41 kg/m2  SpO2 100%  LMP 04/10/2014 Physical Exam  Constitutional: She is oriented to person, place, and time. She appears well-developed and well-nourished.  HENT:  Head: Normocephalic and atraumatic.  Mouth/Throat: Oropharynx is clear and moist.  Eyes: Conjunctivae are  normal. Pupils are equal, round, and reactive to light. Right eye exhibits no discharge. Left eye exhibits no discharge. No scleral icterus.  Neck: Neck supple.  Cardiovascular: Normal rate, regular rhythm and normal heart sounds.   Pulmonary/Chest: Effort normal and breath sounds normal. No respiratory distress. She has no wheezes. She has no rales.  Abdominal: Soft. Bowel sounds are normal. There is no tenderness.  Abdomen is soft, nontender. There is no distention, bowel sounds are normal. No CVA tenderness. No rashes, lesions or other masses appreciated. No peritoneal signs or other evidence of acute abdomen  Musculoskeletal: She exhibits no tenderness.  Neurological: She is alert and oriented to person, place, and time.  Cranial Nerves II-XII grossly intact  Skin: Skin is warm and dry. No rash noted.  Psychiatric: She has a normal mood and affect.  Nursing note and vitals reviewed.   ED Course  Procedures (including critical care time) Labs Review Labs Reviewed  CBC - Abnormal; Notable for the following:    Hemoglobin 11.9 (*)    HCT 35.5 (*)    All other components within normal limits  URINALYSIS, ROUTINE W REFLEX MICROSCOPIC - Abnormal; Notable for the following:    APPearance CLOUDY (*)    Hgb urine dipstick MODERATE (*)    Protein, ur 100 (*)    Leukocytes, UA MODERATE (*)    All other components within normal limits  URINE MICROSCOPIC-ADD ON - Abnormal; Notable for the following:    Bacteria, UA MANY (*)    All other components within normal limits  BASIC METABOLIC PANEL  PREGNANCY, URINE  LIPASE, BLOOD  I-STAT TROPOININ, ED    Imaging Review Ct Abdomen Pelvis W Contrast  04/12/2014   CLINICAL DATA:  LEFT side abdominal pain since 0700 hours on 04/11/2014, worsened at 7 p.m. into sharp LEFT side abdominal pain  EXAM: CT ABDOMEN AND PELVIS WITH CONTRAST  TECHNIQUE: Multidetector CT imaging of the abdomen and pelvis was performed using the standard protocol following  bolus administration of intravenous contrast. Sagittal and coronal MPR images reconstructed from axial data set.  CONTRAST:  136mL OMNIPAQUE IOHEXOL 300 MG/ML SOLN IV. Dilute oral contrast.  COMPARISON:  02/03/2012  FINDINGS: Minimal dependent atelectasis at lung bases.  Suspect hemangioma RIGHT lobe liver 19 x 16 mm image 17 unchanged.  Remainder of liver, spleen, pancreas, kidneys, and adrenal glands normal.  No urinary tract calcification or dilatation though an enhancement is identified at the walls of the LEFT renal collecting system and LEFT ureter raising question of urinary tract infection.  Appendix normal.  Bladder, RIGHT ureter, uterus, and adnexae normal appearance.  Stomach and bowel loops normal appearance.  No mass, adenopathy, free fluid or free air.  No hernia or acute bone lesion.  IMPRESSION: Stable probable hemangioma RIGHT lobe liver.  Diffuse enhancement of the walls of the LEFT renal  collecting system and LEFT ureter raising question of urinary tract infection; recommend correlation with urinalysis.   Electronically Signed   By: Lavonia Dana M.D.   On: 04/12/2014 00:25     EKG Interpretation   Date/Time:  Monday April 11 2014 21:11:24 EST Ventricular Rate:  92 PR Interval:  158 QRS Duration: 86 QT Interval:  360 QTC Calculation: 445 R Axis:   83 Text Interpretation:  Normal sinus rhythm Normal ECG Similar to prior  Confirmed by Mingo Amber  MD, [place] ([id number]) on 04/11/2014 9:19:34 PM     Meds given in ED:  Medications  ondansetron (ZOFRAN) injection 4 mg (4 mg Intravenous Given 04/11/14 2211)  morphine 4 MG/ML injection 4 mg (4 mg Intravenous Given 04/11/14 2211)  sodium chloride 0.9 % bolus 1,000 mL (0 mLs Intravenous Stopped 04/12/14 0049)  iohexol (OMNIPAQUE) 300 MG/ML solution 100 mL (100 mLs Intravenous Contrast Given 04/11/14 2354)  cefTRIAXone (ROCEPHIN) injection 1 g (1 g Intramuscular Given 04/12/14 0049)    Discharge Medication List as of 04/12/2014 12:56 AM    START  taking these medications   Details  cephALEXin (KEFLEX) 500 MG capsule Take 1 capsule (500 mg total) by mouth 4 (four) times daily., Starting 04/12/2014, Until Discontinued, Print       Filed Vitals:   04/11/14 2323 04/11/14 2330 04/11/14 2345 04/12/14 0136  BP: 118/67 113/60 112/59 112/59  Pulse: 77 77 77 92  Temp:    98.4 F (36.9 C)  TempSrc:    Oral  Resp: 14 17 14 18   Height:      Weight:      SpO2: 100% 100% 99% 100%    MDM  Reports on menses now TRENYCE LOERA is a 33 y.o. female who presents with sharp, shooting pain that is brief and intermittent that originate in her left leg and travel through her chest. Only has chest discomfort when pain shoots from her leg.   Vitals stable - WNL -afebrile Pt resting comfortably in ED. Pain improved with analgesia in ED.  PE--not concerning for any other emergent pathology. No evidence of surgical abdomen. Labwork_UA consistent with UTI, otherwise noncontributory. Troponin neg, EKG not concerning Imaging--CT abdomen shows diffuse enhancement of Left renal collecting system walls and left ureter, clinical correlation with UA.  Will trxt empirically for UTI. Rocephin in ED, Keflex outpt  Discussed f/u with PCP and return precautions, pt very amenable to plan. Pt stable, in good condition and is appropriate for discharge.  Prior to patient discharge, I discussed and reviewed this case with Dr.Walden, who also saw and evaluated the pt.     Final diagnoses:  Left sided abdominal pain  UTI (lower urinary tract infection)        Verl Dicker, PA-C 04/12/14 1206  Evelina Bucy, MD 04/12/14 2135

## 2014-04-11 NOTE — ED Notes (Signed)
PT monitored by pulse ox, bp cuff, and 12-lead. 

## 2014-04-12 DIAGNOSIS — N390 Urinary tract infection, site not specified: Secondary | ICD-10-CM | POA: Diagnosis not present

## 2014-04-12 MED ORDER — CEPHALEXIN 500 MG PO CAPS: 500.0000 mg | ORAL_CAPSULE | Freq: Four times a day (QID) | ORAL | Status: DC

## 2014-04-12 MED ORDER — CEFTRIAXONE SODIUM 1 G IJ SOLR
1.0000 g | Freq: Once | INTRAMUSCULAR | Status: AC
  Administered 2014-04-12: 1 g via INTRAMUSCULAR
  Filled 2014-04-12: qty 10

## 2014-05-30 ENCOUNTER — Emergency Department (HOSPITAL_BASED_OUTPATIENT_CLINIC_OR_DEPARTMENT_OTHER)
Admission: EM | Admit: 2014-05-30 | Discharge: 2014-05-30 | Disposition: A | Discharge status: Home / Self Care | Payer: BC Managed Care – PPO | Attending: Emergency Medicine | Admitting: Emergency Medicine

## 2014-05-30 ENCOUNTER — Encounter (HOSPITAL_BASED_OUTPATIENT_CLINIC_OR_DEPARTMENT_OTHER): Payer: Self-pay | Admitting: *Deleted

## 2014-05-30 DIAGNOSIS — Z79899 Other long term (current) drug therapy: Secondary | ICD-10-CM | POA: Diagnosis not present

## 2014-05-30 DIAGNOSIS — Z8742 Personal history of other diseases of the female genital tract: Secondary | ICD-10-CM | POA: Diagnosis not present

## 2014-05-30 DIAGNOSIS — A047 Enterocolitis due to Clostridium difficile: Secondary | ICD-10-CM | POA: Insufficient documentation

## 2014-05-30 DIAGNOSIS — R197 Diarrhea, unspecified: Secondary | ICD-10-CM | POA: Diagnosis present

## 2014-05-30 DIAGNOSIS — A0472 Enterocolitis due to Clostridium difficile, not specified as recurrent: Secondary | ICD-10-CM

## 2014-05-30 DIAGNOSIS — Z9889 Other specified postprocedural states: Secondary | ICD-10-CM | POA: Diagnosis not present

## 2014-05-30 DIAGNOSIS — G40909 Epilepsy, unspecified, not intractable, without status epilepticus: Secondary | ICD-10-CM | POA: Insufficient documentation

## 2014-05-30 LAB — CLOSTRIDIUM DIFFICILE BY PCR: CDIFFPCR: NEGATIVE

## 2014-05-30 MED ORDER — ONDANSETRON HCL 4 MG PO TABS: 4.0000 mg | ORAL_TABLET | Freq: Four times a day (QID) | ORAL | Status: DC

## 2014-05-30 MED ORDER — ONDANSETRON 4 MG PO TBDP
4.0000 mg | ORAL_TABLET | Freq: Once | ORAL | Status: AC
  Administered 2014-05-30: 4 mg via ORAL
  Filled 2014-05-30: qty 1

## 2014-05-30 MED ORDER — METRONIDAZOLE 500 MG PO TABS: 500.0000 mg | ORAL_TABLET | Freq: Three times a day (TID) | ORAL | Status: DC

## 2014-05-30 NOTE — ED Notes (Signed)
Pt c/o n/v/d x 3 weeks , child at home with c-diff

## 2014-05-30 NOTE — ED Provider Notes (Signed)
CSN: [id number]     Arrival date & time 05/30/14  1328 History   First MD Initiated Contact with Patient 05/30/14 1335     Chief Complaint  Patient presents with  . Diarrhea     (Consider location/radiation/quality/duration/timing/severity/associated sxs/prior Treatment) Patient is a 34 y.o. female presenting with diarrhea. The history is provided by the patient.  Diarrhea Quality:  Watery and unusually odiferous Severity:  Moderate Onset quality:  Sudden Number of episodes:  Numerous episodes Duration:  12 hours Timing:  Constant Progression:  Unchanged Relieved by:  Nothing Exacerbated by: food. Ineffective treatments:  None tried Associated symptoms: abdominal pain, chills, fever and vomiting   Associated symptoms: no recent cough and no diaphoresis   Associated symptoms comment:  Abdominal cramping, nausea Risk factors: sick contacts   Risk factors: no recent antibiotic use   Risk factors comment:  Daughter and son with c.diff   Past Medical History  Diagnosis Date  . Cesarean delivery delivered   . Fibrocystic breast disease   . Headache(784.0)     menstrual  . Seizures    Past Surgical History  Procedure Laterality Date  . D & c  2006, 2011  . Hysteroscopy  2004  . Laparoscopy  2004  . Dilation and evacuation  08/16/2011    Procedure: DILATATION AND EVACUATION;  Surgeon: Alwyn Pea, MD;  Location: [facility];  Service: Gynecology;  Laterality: N/A;  . Cesarean section    . Dilation and curettage of uterus     Family History  Problem Relation Age of Onset  . Heart attack Father   . Hypertension Maternal Grandmother    History  Substance Use Topics  . Smoking status: Never Smoker   . Smokeless tobacco: Never Used  . Alcohol Use: No   OB History    Gravida Para Term Preterm AB TAB SAB Ectopic Multiple Living   5 3        1      Review of Systems  Constitutional: Positive for fever and chills. Negative for diaphoresis.  Gastrointestinal: Positive for  vomiting, abdominal pain and diarrhea.  Genitourinary: Negative for dysuria and flank pain.  All other systems reviewed and are negative.     Allergies  Aspirin and Lamictal  Home Medications   Prior to Admission medications   Medication Sig Start Date End Date Taking? Authorizing Provider  carbamazepine (CARBATROL) 300 MG 12 hr capsule Take 300 mg by mouth 2 (two) times daily.    Historical Provider, MD  ESTROGENS CONJ SYNTHETIC A PO Take 1 tablet by mouth daily.     Historical Provider, MD  ibuprofen (ADVIL,MOTRIN) 200 MG tablet Take 400 mg by mouth every 6 (six) hours as needed for pain.    Historical Provider, MD  levETIRAcetam (KEPPRA) 500 MG tablet 1 in the am, 2 at hs Patient not taking: Reported on 04/11/2014 12/28/12   Dennie Bible, NP  metroNIDAZOLE (FLAGYL) 500 MG tablet Take 1 tablet (500 mg total) by mouth 3 (three) times daily. 05/30/14   Blanchie Dessert, MD  naproxen (NAPROSYN) 375 MG tablet Take 1 tablet (375 mg total) by mouth 2 (two) times daily. Patient not taking: Reported on 04/11/2014 12/26/12   April K Palumbo-Rasch, MD  ondansetron (ZOFRAN) 4 MG tablet Take 1 tablet (4 mg total) by mouth every 6 (six) hours. 05/30/14   Blanchie Dessert, MD   BP 144/91 mmHg  Pulse 96  Temp(Src) 98.5 F (36.9 C)  Resp 16  Wt 191 lb (86.637  kg)  SpO2 99%  LMP 04/22/2014 Physical Exam  Constitutional: She is oriented to person, place, and time. She appears well-developed and well-nourished. No distress.  HENT:  Head: Normocephalic and atraumatic.  Mouth/Throat: Oropharynx is clear and moist.  Eyes: Conjunctivae and EOM are normal. Pupils are equal, round, and reactive to light.  Neck: Normal range of motion. Neck supple.  Cardiovascular: Normal rate, regular rhythm and intact distal pulses.   No murmur heard. Pulmonary/Chest: Effort normal and breath sounds normal. No respiratory distress. She has no wheezes. She has no rales.  Abdominal: Soft. She exhibits no  distension. There is generalized tenderness. There is no rebound and no guarding.  Mild diffuse tenderness  Musculoskeletal: Normal range of motion. She exhibits no edema or tenderness.  Neurological: She is alert and oriented to person, place, and time.  Skin: Skin is warm and dry. No rash noted. No erythema.  Psychiatric: She has a normal mood and affect. Her behavior is normal.  Nursing note and vitals reviewed.   ED Course  Procedures (including critical care time) Labs Review Labs Reviewed  CLOSTRIDIUM DIFFICILE BY PCR    Imaging Review No results found.   EKG Interpretation None      MDM   Final diagnoses:  C. difficile diarrhea    Patient last night at 10 PM developed diarrhea, nausea and vomiting. Recently daughter and son both diagnosed C. difficile colitis currently on Flagyl. Children symptoms are improving. Patient denies any recent antibiotic use however with exposure to C. difficile feel that she needs treatment. Stool sample sent. She has mild abdominal cramping but no focal abdominal tenderness concerning for appendicitis, diverticulitis, pancreatitis or cholecystitis.  Patient treated with Flagyl.  C.diff neg.  Attempted to call pt on number below but unable to leave a message  [phone]  Blanchie Dessert, MD 05/31/14 1630

## 2014-05-30 NOTE — Discharge Instructions (Signed)
Clostridium Difficile Infection Clostridium difficile (C. difficile) is a germ found in the intestines. C. difficile infection can occur after taking some medicines. C. difficile infection can cause watery poop (diarrhea) or severe disease. HOME CARE  Drink enough fluids to keep your pee (urine) clear or pale yellow. Avoid milk, caffeine, and alcohol.  Ask your doctor how to replace body fluid losses (rehydrate).  Eat small meals more often rather than large meals.  Take your medicine (antibiotics) as told. Finish it even if you start to feel better.  Do not  use medicines to slow the watery poop.  Wash your hands well after using the bathroom and before preparing food.  Make sure people who live with you wash their hands often.  Clean all surfaces. Use a product that contains chlorine bleach. GET HELP RIGHT AWAY IF:   The watery poop does not stop, or it comes back after you finish your medicine.  You feel very dry or thirsty (dehydrated).  You have a fever.  You have more belly (abdominal) pain or tenderness.  There is blood in your poop (stool), or your poop is black and tar-like.  You cannot eat food or drink liquids without throwing up (vomiting). MAKE SURE YOU:  Understand these instructions.  Will watch your condition.  Will get help right away if you are not doing well or get worse. Document Released: 02/17/2009 Document Revised: 09/06/2013 Document Reviewed: 09/28/2010 [facility] Patient Information 2015 [place], [place]. This information is not intended to replace advice given to you by your health care provider. Make sure you discuss any questions you have with your health care provider.  Clostridium Difficile FAQs What is Clostridium difficile infection?  Clostridium difficile [pronounced Klo-STRID-ee-um dif-uh-SEEL], also known as "C. diff" [See-dif], is a germ that can cause diarrhea. Most cases of C. diff infection occur in patients taking antibiotics. The most  common symptoms of a C. diff infection include:  Watery diarrhea  Fever  Loss of appetite  Nausea  Belly pain and tenderness Who is most likely to get C. diff infection? The elderly and people with certain medical problems have the greatest chance of getting C. diff. C. diff spores can live outside the human body for a very long time and may be found on things in the environment such as bed linens, bed rails, bathroom fixtures, and medical equipment. C. diff infection can spread from person-to-person on contaminated equipment and on the hands of doctors, nurses, other healthcare providers and visitors. Can C. diff infection be treated? Yes, there are antibiotics that can be used to treat C. diff. In some severe cases, a person might have to have surgery to remove the infected part of the intestines. This surgery is needed in only 1 or 2 out of every 100 persons with C. diff. What are some of the things that hospitals are doing to prevent C. diff infections? To prevent C. diff infections, doctors, nurses, and other healthcare providers:  Clean their hands with soap and water or an alcohol-based hand rub before and after caring for every patient. This can prevent C. diff and other germs from being passed from one patient to another on their hands.  Carefully clean hospital rooms and medical equipment that have been used for patients with C. diff.  Use Contact Precautions to prevent C. diff from spreading to other patients. Contact Precautions mean:  Whenever possible, patients with C. diff will have a single room or share a room only with someone else who  also has C. diff.  Healthcare providers will put on gloves and wear a gown over their clothing while taking care of patients with C. diff.  Visitors may also be asked to wear a gown and gloves.  When leaving the room, hospital providers and visitors remove their gown and gloves and clean their hands.  Patients on Contact Precautions  are asked to stay in their hospital rooms as much as possible. They should not go to common areas, such as the gift shop or cafeteria. They can go to other areas of the hospital for treatments and tests.  Only give patients antibiotics when it is necessary. What can I do to help prevent C. diff infections?  Make sure that all doctors, nurses, and other healthcare providers clean their hands with soap and water or an alcohol-based hand rub before and after caring for you.  If you do not see your providers clean their hands, please ask them to do so.  Only take antibiotics as prescribed by your doctor.  Be sure to clean your own hands often, especially after using the bathroom and before eating. Can my friends and family get C. diff when they visit me? C. diff infection usually does not occur in persons who are not taking antibiotics. Visitors are not likely to get C. diff. Still, to make it safer for visitors, they should:  Clean their hands before they enter your room and as they leave your room  Ask the nurse if they need to wear protective gowns and gloves when they visit you. What do I need to do when I go home from the hospital? Once you are back at home, you can return to your normal routine. Often, the diarrhea will be better or completely gone before you go home. This makes giving C. diff to other people much less likely. There are a few things you should do, however, to lower the chances of developing C. diff infection again or of spreading it to others.  If you are given a prescription to treat C. diff, take the medicine exactly as prescribed by your doctor and pharmacist. Do not take half-doses or stop before you run out.  Wash your hands often, especially after going to the bathroom and before preparing food.  People who live with you should wash their hands often as well.  If you develop more diarrhea after you get home, tell your doctor immediately.  Your doctor may give you  additional instructions. If you have questions, please ask your doctor or nurse. Developed and co-sponsored by [employer] for [place] [phone]); Infectious Diseases Society of [place] (IDSA); [place]; Association for Professionals in Infection Control and Epidemiology (APIC); Centers for Disease Control and Prevention (CDC); and [employer]. Document Released: 04/27/2013 Document Reviewed: 04/27/2013 [facility] Patient Information 2015 [place], [place]. This information is not intended to replace advice given to you by your health care provider. Make sure you discuss any questions you have with your health care provider.

## 2014-06-08 ENCOUNTER — Telehealth (HOSPITAL_COMMUNITY): Payer: Self-pay

## 2014-07-24 ENCOUNTER — Encounter (HOSPITAL_BASED_OUTPATIENT_CLINIC_OR_DEPARTMENT_OTHER): Payer: Self-pay | Admitting: *Deleted

## 2014-07-24 ENCOUNTER — Emergency Department (HOSPITAL_BASED_OUTPATIENT_CLINIC_OR_DEPARTMENT_OTHER): Payer: BC Managed Care – PPO

## 2014-07-24 DIAGNOSIS — Z792 Long term (current) use of antibiotics: Secondary | ICD-10-CM | POA: Diagnosis not present

## 2014-07-24 DIAGNOSIS — Z8742 Personal history of other diseases of the female genital tract: Secondary | ICD-10-CM | POA: Insufficient documentation

## 2014-07-24 DIAGNOSIS — G40909 Epilepsy, unspecified, not intractable, without status epilepticus: Secondary | ICD-10-CM | POA: Diagnosis not present

## 2014-07-24 DIAGNOSIS — Z79899 Other long term (current) drug therapy: Secondary | ICD-10-CM | POA: Diagnosis not present

## 2014-07-24 DIAGNOSIS — R079 Chest pain, unspecified: Secondary | ICD-10-CM | POA: Diagnosis not present

## 2014-07-24 DIAGNOSIS — Z791 Long term (current) use of non-steroidal anti-inflammatories (NSAID): Secondary | ICD-10-CM | POA: Insufficient documentation

## 2014-07-24 NOTE — ED Notes (Signed)
C/o CP and sob, also reports hard to get a deep breath, dizziness, hot flashes, nausea, lethargic and slight cough. onset 1 week ago, "worse Thursday". have been seen for same/ similar before, has tried tylenol. (denies: fever).

## 2014-07-25 ENCOUNTER — Emergency Department (HOSPITAL_BASED_OUTPATIENT_CLINIC_OR_DEPARTMENT_OTHER)
Admission: EM | Admit: 2014-07-25 | Discharge: 2014-07-25 | Disposition: A | Discharge status: Home / Self Care | Payer: BC Managed Care – PPO | Attending: Emergency Medicine | Admitting: Emergency Medicine

## 2014-07-25 DIAGNOSIS — R079 Chest pain, unspecified: Secondary | ICD-10-CM

## 2014-07-25 DIAGNOSIS — R52 Pain, unspecified: Secondary | ICD-10-CM

## 2014-07-25 LAB — CBC WITH DIFFERENTIAL/PLATELET
BASOS PCT: 0 % (ref 0–1)
Basophils Absolute: 0 10*3/uL (ref 0.0–0.1)
Eosinophils Absolute: 0.1 10*3/uL (ref 0.0–0.7)
Eosinophils Relative: 2 % (ref 0–5)
HCT: 34.6 % — ABNORMAL LOW (ref 36.0–46.0)
HEMOGLOBIN: 11.6 g/dL — AB (ref 12.0–15.0)
Lymphocytes Relative: 32 % (ref 12–46)
Lymphs Abs: 1.7 10*3/uL (ref 0.7–4.0)
MCH: 28.3 pg (ref 26.0–34.0)
MCHC: 33.5 g/dL (ref 30.0–36.0)
MCV: 84.4 fL (ref 78.0–100.0)
Monocytes Absolute: 0.6 10*3/uL (ref 0.1–1.0)
Monocytes Relative: 11 % (ref 3–12)
NEUTROS PCT: 55 % (ref 43–77)
Neutro Abs: 3.1 10*3/uL (ref 1.7–7.7)
Platelets: 311 10*3/uL (ref 150–400)
RBC: 4.1 MIL/uL (ref 3.87–5.11)
RDW: 12.5 % (ref 11.5–15.5)
WBC: 5.5 10*3/uL (ref 4.0–10.5)

## 2014-07-25 LAB — COMPREHENSIVE METABOLIC PANEL
ALBUMIN: 3.7 g/dL (ref 3.5–5.2)
ALK PHOS: 84 U/L (ref 39–117)
ALT: 16 U/L (ref 0–35)
AST: 16 U/L (ref 0–37)
Anion gap: 6 (ref 5–15)
BILIRUBIN TOTAL: 0.1 mg/dL — AB (ref 0.3–1.2)
BUN: 10 mg/dL (ref 6–23)
CHLORIDE: 104 mmol/L (ref 96–112)
CO2: 26 mmol/L (ref 19–32)
Calcium: 8.6 mg/dL (ref 8.4–10.5)
Creatinine, Ser: 0.63 mg/dL (ref 0.50–1.10)
GFR calc Af Amer: >90 mL/min (ref >90)
GFR calc non Af Amer: >90 mL/min (ref >90)
Glucose, Bld: 113 mg/dL — ABNORMAL HIGH (ref 70–99)
POTASSIUM: 4.2 mmol/L (ref 3.5–5.1)
Sodium: 136 mmol/L (ref 135–145)
Total Protein: 7.1 g/dL (ref 6.0–8.3)

## 2014-07-25 LAB — TROPONIN I: Troponin I: <0.03 ng/mL (ref <0.031)

## 2014-07-25 LAB — CARBAMAZEPINE LEVEL, TOTAL: CARBAMAZEPINE LVL: 9.5 ug/mL (ref 4.0–12.0)

## 2014-07-25 LAB — BRAIN NATRIURETIC PEPTIDE: B NATRIURETIC PEPTIDE 5: 21.2 pg/mL (ref 0.0–100.0)

## 2014-07-25 MED ORDER — ALBUTEROL SULFATE (2.5 MG/3ML) 0.083% IN NEBU
5.0000 mg | INHALATION_SOLUTION | Freq: Once | RESPIRATORY_TRACT | Status: AC
Start: 2014-07-25 — End: 2014-07-25
  Administered 2014-07-25: 5 mg via RESPIRATORY_TRACT
  Filled 2014-07-25: qty 6

## 2014-07-25 NOTE — Discharge Instructions (Signed)
Ibuprofen 600 mg every 6 hours as needed for pain.  Follow-up with your primary Dr. if not improving in the next few days, and return to the ER if your symptoms significantly worsen or change.   Chest Pain (Nonspecific) It is often hard to give a specific diagnosis for the cause of chest pain. There is always a chance that your pain could be related to something serious, such as a heart attack or a blood clot in the lungs. You need to follow up with your health care provider for further evaluation. CAUSES   Heartburn.  Pneumonia or bronchitis.  Anxiety or stress.  Inflammation around your heart (pericarditis) or lung (pleuritis or pleurisy).  A blood clot in the lung.  A collapsed lung (pneumothorax). It can develop suddenly on its own (spontaneous pneumothorax) or from trauma to the chest.  Shingles infection (herpes zoster virus). The chest wall is composed of bones, muscles, and cartilage. Any of these can be the source of the pain.  The bones can be bruised by injury.  The muscles or cartilage can be strained by coughing or overwork.  The cartilage can be affected by inflammation and become sore (costochondritis). DIAGNOSIS  Lab tests or other studies may be needed to find the cause of your pain. Your health care provider may have you take a test called an ambulatory electrocardiogram (ECG). An ECG records your heartbeat patterns over a 24-hour period. You may also have other tests, such as:  Transthoracic echocardiogram (TTE). During echocardiography, sound waves are used to evaluate how blood flows through your heart.  Transesophageal echocardiogram (TEE).  Cardiac monitoring. This allows your health care provider to monitor your heart rate and rhythm in real time.  Holter monitor. This is a portable device that records your heartbeat and can help diagnose heart arrhythmias. It allows your health care provider to track your heart activity for several days, if  needed.  Stress tests by exercise or by giving medicine that makes the heart beat faster. TREATMENT   Treatment depends on what may be causing your chest pain. Treatment may include:  Acid blockers for heartburn.  Anti-inflammatory medicine.  Pain medicine for inflammatory conditions.  Antibiotics if an infection is present.  You may be advised to change lifestyle habits. This includes stopping smoking and avoiding alcohol, caffeine, and chocolate.  You may be advised to keep your head raised (elevated) when sleeping. This reduces the chance of acid going backward from your stomach into your esophagus. Most of the time, nonspecific chest pain will improve within 2-3 days with rest and mild pain medicine.  HOME CARE INSTRUCTIONS   If antibiotics were prescribed, take them as directed. Finish them even if you start to feel better.  For the next few days, avoid physical activities that bring on chest pain. Continue physical activities as directed.  Do not use any tobacco products, including cigarettes, chewing tobacco, or electronic cigarettes.  Avoid drinking alcohol.  Only take medicine as directed by your health care provider.  Follow your health care provider's suggestions for further testing if your chest pain does not go away.  Keep any follow-up appointments you made. If you do not go to an appointment, you could develop lasting (chronic) problems with pain. If there is any problem keeping an appointment, call to reschedule. SEEK MEDICAL CARE IF:   Your chest pain does not go away, even after treatment.  You have a rash with blisters on your chest.  You have a fever. SEEK  IMMEDIATE MEDICAL CARE IF:   You have increased chest pain or pain that spreads to your arm, neck, jaw, back, or abdomen.  You have shortness of breath.  You have an increasing cough, or you cough up blood.  You have severe back or abdominal pain.  You feel nauseous or vomit.  You have severe  weakness.  You faint.  You have chills. This is an emergency. Do not wait to see if the pain will go away. Get medical help at once. Call your local emergency services (911 in [place] Do not drive yourself to the hospital. MAKE SURE YOU:   Understand these instructions.  Will watch your condition.  Will get help right away if you are not doing well or get worse. Document Released: 01/30/2005 Document Revised: 04/27/2013 Document Reviewed: 11/26/2007 [facility] Patient Information 2015 [place], [place]. This information is not intended to replace advice given to you by your health care provider. Make sure you discuss any questions you have with your health care provider.

## 2014-07-25 NOTE — ED Notes (Signed)
Pt given water for PO challenge 

## 2014-07-25 NOTE — ED Provider Notes (Signed)
CSN: [id number]     Arrival date & time 07/24/14  2255 History  This chart was scribed for Veryl Speak, MD by Chester Holstein, ED Scribe. This patient was seen in room MH02/MH02 and the patient's care was started at 12:36 AM.    Chief Complaint  Patient presents with  . Chest Pain     Patient is a 34 y.o. female presenting with chest pain. The history is provided by the patient. No language interpreter was used.  Chest Pain  HPI Comments: SHANEIKA ROSSA is a 34 y.o. female who presents to the Emergency Department complaining of intermittent pressure and sharp chest pain with onset 1 week ago. Pt notes associated SOB, nausea, dry cough, fatigue. Pt notes dyspnea with talking and exertion. Pt states she does not have an active lifestyle. Pt states her dosage of carbamazepine dosage was recently lowered. Pt is not a smoker. Pt notes FHx of cardiac problems. Pt denies wheezing, swelling or pain in legs, fever, and abdominal pain.  Past Medical History  Diagnosis Date  . Cesarean delivery delivered   . Fibrocystic breast disease   . Headache(784.0)     menstrual  . Seizures    Past Surgical History  Procedure Laterality Date  . D & c  2006, 2011  . Hysteroscopy  2004  . Laparoscopy  2004  . Dilation and evacuation  08/16/2011    Procedure: DILATATION AND EVACUATION;  Surgeon: Alwyn Pea, MD;  Location: [facility];  Service: Gynecology;  Laterality: N/A;  . Cesarean section    . Dilation and curettage of uterus     Family History  Problem Relation Age of Onset  . Heart attack Father   . Hypertension Maternal Grandmother    History  Substance Use Topics  . Smoking status: Never Smoker   . Smokeless tobacco: Never Used  . Alcohol Use: No   OB History    Gravida Para Term Preterm AB TAB SAB Ectopic Multiple Living   5 3        1      Review of Systems  Cardiovascular: Positive for chest pain.   A complete 10 system review of systems was obtained and all systems are negative  except as noted in the HPI and PMH.     Allergies  Aspirin and Lamictal  Home Medications   Prior to Admission medications   Medication Sig Start Date End Date Taking? Authorizing Provider  carbamazepine (CARBATROL) 300 MG 12 hr capsule Take 300 mg by mouth 2 (two) times daily.    Historical Provider, MD  ESTROGENS CONJ SYNTHETIC A PO Take 1 tablet by mouth daily.     Historical Provider, MD  ibuprofen (ADVIL,MOTRIN) 200 MG tablet Take 400 mg by mouth every 6 (six) hours as needed for pain.    Historical Provider, MD  levETIRAcetam (KEPPRA) 500 MG tablet 1 in the am, 2 at hs Patient not taking: Reported on 04/11/2014 12/28/12   Otilio Jefferson, NP  metroNIDAZOLE (FLAGYL) 500 MG tablet Take 1 tablet (500 mg total) by mouth 3 (three) times daily. 05/30/14   Blanchie Dessert, MD  naproxen (NAPROSYN) 375 MG tablet Take 1 tablet (375 mg total) by mouth 2 (two) times daily. Patient not taking: Reported on 04/11/2014 12/26/12   April Palumbo, MD  ondansetron (ZOFRAN) 4 MG tablet Take 1 tablet (4 mg total) by mouth every 6 (six) hours. 05/30/14   Blanchie Dessert, MD   BP 126/70 mmHg  Pulse 79  Temp(Src)  98.2 F (36.8 C) (Oral)  Resp 18  Ht 5' 10.5" (1.791 m)  Wt 190 lb (86.183 kg)  BMI 26.87 kg/m2  SpO2 100%  LMP 06/26/2014 Physical Exam  Constitutional: She is oriented to person, place, and time. She appears well-developed and well-nourished.  HENT:  Head: Normocephalic.  Eyes: Conjunctivae are normal.  Neck: Normal range of motion. Neck supple.  Cardiovascular: Normal rate, regular rhythm and normal heart sounds.  Exam reveals no friction rub.   No murmur heard. Pulmonary/Chest: Effort normal and breath sounds normal. No respiratory distress. She has no wheezes. She has no rales.  Musculoskeletal: Normal range of motion. She exhibits no edema.  Neurological: She is alert and oriented to person, place, and time.  Skin: Skin is warm and dry.  Psychiatric: She has a normal mood and affect.  Her behavior is normal.  Nursing note and vitals reviewed.   ED Course  Procedures (including critical care time) DIAGNOSTIC STUDIES: Oxygen Saturation is 100% on room air, normal by my interpretation.    COORDINATION OF CARE: 12:43 AM Discussed treatment plan with patient at beside, the patient agrees with the plan and has no further questions at this time.   Labs Review Labs Reviewed  CBC WITH DIFFERENTIAL/PLATELET  COMPREHENSIVE METABOLIC PANEL  TROPONIN I  CARBAMAZEPINE LEVEL, TOTAL    Imaging Review Dg Chest 2 View  07/25/2014   CLINICAL DATA:  Chest pain and dyspnea.  EXAM: CHEST  2 VIEW  COMPARISON:  03/01/2014  FINDINGS: The heart size and mediastinal contours are within normal limits. Both lungs are clear. The visualized skeletal structures are unremarkable.  IMPRESSION: No active cardiopulmonary disease.   Electronically Signed   By: Andreas Newport M.D.   On: 07/25/2014 00:04     EKG Interpretation   Date/Time:  Sunday July 24 2014 23:05:17 EDT Ventricular Rate:  73 PR Interval:  146 QRS Duration: 88 QT Interval:  380 QTC Calculation: 418 R Axis:   70 Text Interpretation:  Normal sinus rhythm Normal ECG Confirmed by DELOS   MD, Sameerah Nachtigal ([id number]) on 07/25/2014 12:41:22 AM      MDM   Final diagnoses:  Pain    Patient presents with complaints of chest pain that is atypical for cardiac pain. Her physical examination is unremarkable and workup reveals no evidence for a cardiac etiology. Her EKG is normal and troponin is negative despite pain for the past week. She will be discharged to home with anti-inflammatories and when necessary return.   I personally performed the services described in this documentation, which was scribed in my presence. The recorded information has been reviewed and is accurate.      Veryl Speak, MD 07/25/14 [phone]

## 2014-07-25 NOTE — ED Notes (Signed)
MD at bedside. 

## 2014-07-25 NOTE — ED Notes (Signed)
MD at bedside discussing test results and dispo plan of care. 

## 2015-02-23 ENCOUNTER — Emergency Department (HOSPITAL_COMMUNITY): Payer: BC Managed Care – PPO

## 2015-02-23 ENCOUNTER — Emergency Department (HOSPITAL_COMMUNITY)
Admission: EM | Admit: 2015-02-23 | Discharge: 2015-02-23 | Disposition: A | Discharge status: Home / Self Care | Payer: BC Managed Care – PPO | Attending: Physician Assistant | Admitting: Physician Assistant

## 2015-02-23 ENCOUNTER — Encounter (HOSPITAL_COMMUNITY): Payer: Self-pay

## 2015-02-23 DIAGNOSIS — R05 Cough: Secondary | ICD-10-CM | POA: Insufficient documentation

## 2015-02-23 DIAGNOSIS — R059 Cough, unspecified: Secondary | ICD-10-CM

## 2015-02-23 DIAGNOSIS — G40909 Epilepsy, unspecified, not intractable, without status epilepticus: Secondary | ICD-10-CM | POA: Diagnosis not present

## 2015-02-23 DIAGNOSIS — Z79899 Other long term (current) drug therapy: Secondary | ICD-10-CM | POA: Insufficient documentation

## 2015-02-23 HISTORY — DX: Epilepsy, unspecified, not intractable, without status epilepticus: G40.909

## 2015-02-23 MED ORDER — GUAIFENESIN-CODEINE 100-10 MG/5ML PO SOLN: 5.0000 mL | Freq: Four times a day (QID) | ORAL | Status: DC | PRN

## 2015-02-23 MED ORDER — GUAIFENESIN-CODEINE 100-10 MG/5ML PO SOLN
5.0000 mL | Freq: Once | ORAL | Status: AC
  Administered 2015-02-23: 5 mL via ORAL
  Filled 2015-02-23: qty 5

## 2015-02-23 MED ORDER — IBUPROFEN 200 MG PO TABS
600.0000 mg | ORAL_TABLET | Freq: Once | ORAL | Status: AC
  Administered 2015-02-23: 600 mg via ORAL
  Filled 2015-02-23: qty 3

## 2015-02-23 NOTE — ED Notes (Signed)
Pt reports a dry cough x2 days with SOB.  Pt also reports mid sternal chest pain with inspiration.  Pt denies any fever or other cold symptoms.  Pt does report she had cold symptoms aprox. 2 weeks ago that have resolved.

## 2015-02-23 NOTE — Discharge Instructions (Signed)
Please follow up with your regular physician.  Cough, Adult A cough helps to clear your throat and lungs. A cough may last only 2-3 weeks (acute), or it may last longer than 8 weeks (chronic). Many different things can cause a cough. A cough may be a sign of an illness or another medical condition. HOME CARE  Pay attention to any changes in your cough.  Take medicines only as told by your doctor.  If you were prescribed an antibiotic medicine, take it as told by your doctor. Do not stop taking it even if you start to feel better.  Talk with your doctor before you try using a cough medicine.  Drink enough fluid to keep your pee (urine) clear or pale yellow.  If the air is dry, use a cold steam vaporizer or humidifier in your home.  Stay away from things that make you cough at work or at home.  If your cough is worse at night, try using extra pillows to raise your head up higher while you sleep.  Do not smoke, and try not to be around smoke. If you need help quitting, ask your doctor.  Do not have caffeine.  Do not drink alcohol.  Rest as needed. GET HELP IF:  You have new problems (symptoms).  You cough up yellow fluid (pus).  Your cough does not get better after 2-3 weeks, or your cough gets worse.  Medicine does not help your cough and you are not sleeping well.  You have pain that gets worse or pain that is not helped with medicine.  You have a fever.  You are losing weight and you do not know why.  You have night sweats. GET HELP RIGHT AWAY IF:  You cough up blood.  You have trouble breathing.  Your heartbeat is very fast.   This information is not intended to replace advice given to you by your health care provider. Make sure you discuss any questions you have with your health care provider.   Document Released: 01/03/2011 Document Revised: 01/11/2015 Document Reviewed: 06/29/2014 Elsevier Interactive Patient Education [employer].

## 2015-02-23 NOTE — ED Provider Notes (Signed)
CSN: [id number]     Arrival date & time 02/23/15  4098 History   First MD Initiated Contact with Patient 02/23/15 [phone]     Chief Complaint  Patient presents with  . Cough     (Consider location/radiation/quality/duration/timing/severity/associated sxs/prior Treatment) HPI   Patient is a 34 old file history of epilepsy presenting today with cough. Patient had 2 days of cough. She had remote cold infection 2 weeks ago. Patient has not had any fever, rhinorrhea, congestion, nausea, vomiting, diarrhea. She feels in her usual state of health except for this cough. She is concerned because at work they moved offices due to mold. She is concerned about a mold exposure causing this cough.  Past Medical History  Diagnosis Date  . Cesarean delivery delivered   . Fibrocystic breast disease   . Headache(784.0)     menstrual  . Seizures ([place])   . Epilepsy [facility])    Past Surgical History  Procedure Laterality Date  . D & c  2006, 2011  . Hysteroscopy  2004  . Laparoscopy  2004  . Dilation and evacuation  08/16/2011    Procedure: DILATATION AND EVACUATION;  Surgeon: Alwyn Pea, MD;  Location: [facility];  Service: Gynecology;  Laterality: N/A;  . Cesarean section    . Dilation and curettage of uterus     Family History  Problem Relation Age of Onset  . Heart attack Father   . Hypertension Maternal Grandmother    Social History  Substance Use Topics  . Smoking status: Never Smoker   . Smokeless tobacco: Never Used  . Alcohol Use: No   OB History    Gravida Para Term Preterm AB TAB SAB Ectopic Multiple Living   5 3        1      Review of Systems  Constitutional: Negative for activity change and fatigue.  HENT: Negative for congestion and drooling.   Eyes: Negative for discharge.  Respiratory: Positive for cough and chest tightness.   Cardiovascular: Negative for chest pain.  Gastrointestinal: Negative for abdominal distention.  Genitourinary: Negative for dysuria and difficulty  urinating.  Musculoskeletal: Negative for joint swelling.  Skin: Negative for rash.  Allergic/Immunologic: Negative for immunocompromised state.  Neurological: Negative for seizures and speech difficulty.  Psychiatric/Behavioral: Negative for behavioral problems and agitation.      Allergies  Aspirin and Lamictal  Home Medications   Prior to Admission medications   Medication Sig Start Date End Date Taking? Authorizing Provider  carbamazepine (CARBATROL) 300 MG 12 hr capsule Take 300 mg by mouth 2 (two) times daily.    Historical Provider, MD  ESTROGENS CONJ SYNTHETIC A PO Take 1 tablet by mouth daily.     Historical Provider, MD  ibuprofen (ADVIL,MOTRIN) 200 MG tablet Take 400 mg by mouth every 6 (six) hours as needed for pain.    Historical Provider, MD  levETIRAcetam (KEPPRA) 500 MG tablet 1 in the am, 2 at hs Patient not taking: Reported on 04/11/2014 12/28/12   Dennie Bible, NP  metroNIDAZOLE (FLAGYL) 500 MG tablet Take 1 tablet (500 mg total) by mouth 3 (three) times daily. 05/30/14   Blanchie Dessert, MD  naproxen (NAPROSYN) 375 MG tablet Take 1 tablet (375 mg total) by mouth 2 (two) times daily. Patient not taking: Reported on 04/11/2014 12/26/12   April Palumbo, MD  ondansetron (ZOFRAN) 4 MG tablet Take 1 tablet (4 mg total) by mouth every 6 (six) hours. 05/30/14   Blanchie Dessert, MD  BP 133/75 mmHg  Pulse 78  Temp(Src) 98.2 F (36.8 C) (Oral)  Resp 19  Ht 5\' 10"  (1.778 m)  Wt 185 lb (83.915 kg)  BMI 26.54 kg/m2  SpO2 99%  LMP 02/22/2015 Physical Exam  Constitutional: She is oriented to person, place, and time. She appears well-developed and well-nourished.  HENT:  Head: Normocephalic and atraumatic.  Eyes: Conjunctivae are normal. Right eye exhibits no discharge.  Neck: Neck supple.  Cardiovascular: Normal rate, regular rhythm and normal heart sounds.   No murmur heard. Pulmonary/Chest: Effort normal and breath sounds normal. She has no wheezes. She has no  rales.  Abdominal: Soft. She exhibits no distension. There is no tenderness.  Musculoskeletal: Normal range of motion. She exhibits no edema.  Neurological: She is oriented to person, place, and time. No cranial nerve deficit.  Skin: Skin is warm and dry. No rash noted. She is not diaphoretic.  Psychiatric: She has a normal mood and affect. Her behavior is normal.  Nursing note and vitals reviewed.   ED Course  Procedures (including critical care time) Labs Review Labs Reviewed - No data to display  Imaging Review No results found. I have personally reviewed and evaluated these images and lab results as part of my medical decision-making.   EKG Interpretation   Date/Time:  Thursday February 23 2015 09:34:22 EDT Ventricular Rate:  78 PR Interval:  153 QRS Duration: 88 QT Interval:  399 QTC Calculation: 454 R Axis:   64 Text Interpretation:  Sinus rhythm RSR' in V1 or V2, probably normal  variant Borderline T abnormalities, anterior leads No significant change  since last tracing no acurte ischemia Confirmed by Gerald Leitz  [id number]) on 02/23/2015 10:16:03 AM      MDM   Final diagnoses:  None   patient is a 34 year-old [place] American female with history epilepsy presenting with cough. Patient's concerned it is from mold at work. Patient's cough sounds nonproductive. Has been going on for 3 days. No fevers associated symptoms. Likely from environmental exposure. We'll get a chest x-ray. We'll give cough syrup with codeine and ibuprofen to take home. We will give work note for 2 days and then have her follow-up with her primary care physician.    Phuoc Huy Julio Alm, MD 02/23/15 [phone]

## 2015-02-24 ENCOUNTER — Telehealth (HOSPITAL_COMMUNITY): Payer: Self-pay

## 2015-02-24 ENCOUNTER — Telehealth: Payer: Self-pay | Admitting: *Deleted

## 2015-02-24 NOTE — Telephone Encounter (Signed)
Patient wanting copy of MD note.  Patient given medical records # and contact info.

## 2015-02-24 NOTE — Telephone Encounter (Signed)
Pt called stating she was concerned about taking newly prescribed Rx because she is on CARBAMAZEPINE for seizures.  NCM advised pt to confirm adverse drug interactions with neurologist as I could not find any on internet.

## 2015-08-01 ENCOUNTER — Encounter (HOSPITAL_BASED_OUTPATIENT_CLINIC_OR_DEPARTMENT_OTHER): Payer: Self-pay | Admitting: *Deleted

## 2015-08-01 ENCOUNTER — Emergency Department (HOSPITAL_BASED_OUTPATIENT_CLINIC_OR_DEPARTMENT_OTHER)
Admission: EM | Admit: 2015-08-01 | Discharge: 2015-08-01 | Disposition: A | Discharge status: Home / Self Care | Payer: BC Managed Care – PPO | Attending: Emergency Medicine | Admitting: Emergency Medicine

## 2015-08-01 DIAGNOSIS — Z3202 Encounter for pregnancy test, result negative: Secondary | ICD-10-CM | POA: Insufficient documentation

## 2015-08-01 DIAGNOSIS — R350 Frequency of micturition: Secondary | ICD-10-CM

## 2015-08-01 DIAGNOSIS — G40909 Epilepsy, unspecified, not intractable, without status epilepticus: Secondary | ICD-10-CM | POA: Insufficient documentation

## 2015-08-01 DIAGNOSIS — Z79899 Other long term (current) drug therapy: Secondary | ICD-10-CM | POA: Insufficient documentation

## 2015-08-01 DIAGNOSIS — Z8742 Personal history of other diseases of the female genital tract: Secondary | ICD-10-CM | POA: Diagnosis not present

## 2015-08-01 LAB — URINALYSIS, ROUTINE W REFLEX MICROSCOPIC
Bilirubin Urine: NEGATIVE
Glucose, UA: NEGATIVE mg/dL
Hgb urine dipstick: NEGATIVE
KETONES UR: NEGATIVE mg/dL
LEUKOCYTES UA: NEGATIVE
Nitrite: NEGATIVE
PROTEIN: NEGATIVE mg/dL
Specific Gravity, Urine: 1.016 (ref 1.005–1.030)
pH: 6 (ref 5.0–8.0)

## 2015-08-01 LAB — CBG MONITORING, ED: Glucose-Capillary: 85 mg/dL (ref 65–99)

## 2015-08-01 LAB — PREGNANCY, URINE: PREG TEST UR: NEGATIVE

## 2015-08-01 NOTE — ED Notes (Signed)
Pt c/o increased urinary frequency over the last two weeks with intermittent blurred vision, but denies any increased thirst.  Pt denies back pain, denies fevers, denies abdominal pain and denies vaginal discharge.

## 2015-08-01 NOTE — Discharge Instructions (Signed)
Urinary Frequency °The number of times a normal person urinates depends upon how much liquid they take in and how much liquid they are losing. If the temperature is hot and there is high humidity, then the person will sweat more and usually breathe a little more frequently. These factors decrease the amount of frequency of urination that would be considered normal. °The amount you drink is easily determined, but the amount of fluid lost is sometimes more difficult to calculate.  °Fluid is lost in two ways: °· Sensible fluid loss is usually measured by the amount of urine that you get rid of. Losses of fluid can also occur with diarrhea. °· Insensible fluid loss is more difficult to measure. It is caused by evaporation. Insensible loss of fluid occurs through breathing and sweating. It usually ranges from a little less than a quart to a little more than a quart of fluid a day. °In normal temperatures and activity levels, the average person may urinate 4 to 7 times in a 24-hour period. Needing to urinate more often than that could indicate a problem. If one urinates 4 to 7 times in 24 hours and has large volumes each time, that could indicate a different problem from one who urinates 4 to 7 times a day and has small volumes. The time of urinating is also important. Most urinating should be done during the waking hours. Getting up at night to urinate frequently can indicate some problems. °CAUSES  °The bladder is the organ in your lower abdomen that holds urine. Like a balloon, it swells some as it fills up. Your nerves sense this and tell you it is time to head for the bathroom. There are a number of reasons that you might feel the need to urinate more often than usual. They include: °· Urinary tract infection. This is usually associated with other signs such as burning when you urinate. °· In men, problems with the prostate (a walnut-size gland that is located near the tube that carries urine out of your body). There  are two reasons why the prostate can cause an increased frequency of urination: °¨ An enlarged prostate that does not let the bladder empty well. If the bladder only half empties when you urinate, then it only has half the capacity to fill before you have to urinate again. °¨ The nerves in the bladder become more hypersensitive with an increased size of the prostate even if the bladder empties completely. °· Pregnancy. °· Obesity. Excess weight is more likely to cause a problem for women than for men. °· Bladder stones or other bladder problems. °· Caffeine. °· Alcohol. °· Medications. For example, drugs that help the body get rid of extra fluid (diuretics) increase urine production. Some other medicines must be taken with lots of fluids. °· Muscle or nerve weakness. This might be the result of a spinal cord injury, a stroke, multiple sclerosis, or Parkinson disease. °· Long-standing diabetes can decrease the sensation of the bladder. This loss of sensation makes it harder to sense the bladder needs to be emptied. Over a period of years, the bladder is stretched out by constant overfilling. This weakens the bladder muscles so that the bladder does not empty well and has less capacity to fill with new urine. °· Interstitial cystitis (also called painful bladder syndrome). This condition develops because the tissues that line the inside of the bladder are inflamed (inflammation is the body's way of reacting to injury or infection). It causes pain and frequent   urination. It occurs in women more often than in men. °DIAGNOSIS  °· To decide what might be causing your urinary frequency, your health care provider will probably: °¨ Ask about symptoms you have noticed. °¨ Ask about your overall health. This will include questions about any medications you are taking. °¨ Do a physical examination. °· Order some tests. These might include: °¨ A blood test to check for diabetes or other health issues that could be contributing  to the problem. °¨ Urine testing. This could measure the flow of urine and the pressure on the bladder. °¨ A test of your neurological system (the brain, spinal cord, and nerves). This is the system that senses the need to urinate. °¨ A bladder test to check whether it is emptying completely when you urinate. °¨ Cystoscopy. This test uses a thin tube with a tiny camera on it. It offers a look inside your urethra and bladder to see if there are problems. °¨ Imaging tests. You might be given a contrast dye and then asked to urinate. X-rays are taken to see how your bladder is working. °TREATMENT  °It is important for you to be evaluated to determine if the amount or frequency that you have is unusual or abnormal. If it is found to be abnormal, the cause should be determined and this can usually be found out easily. Depending upon the cause, treatment could include medication, stimulation of the nerves, or surgery. °There are not too many things that you can do as an individual to change your urinary frequency. It is important that you balance the amount of fluid intake needed to compensate for your activity and the temperature. Medical problems will be diagnosed and taken care of by your physician. There is no particular bladder training such as Kegel exercises that you can do to help urinary frequency. This is an exercise that is usually recommended for people who have leaking of urine when they laugh, cough, or sneeze. °HOME CARE INSTRUCTIONS  °· Take any medications your health care provider prescribed or suggested. Follow the directions carefully. °· Practice any lifestyle changes that are recommended. These might include: °¨ Drinking less fluid or drinking at different times of the day. If you need to urinate often during the night, for example, you may need to stop drinking fluids early in the evening. °¨ Cutting down on caffeine or alcohol. They both can make you need to urinate more often than normal. Caffeine  is found in coffee, tea, and sodas. °¨ Losing weight, if that is recommended. °· Keep a journal or a log. You might be asked to record how much you drink and when and where you feel the need to urinate. This will also help evaluate how well the treatment provided by your physician is working. °SEEK MEDICAL CARE IF:  °· Your need to urinate often gets worse. °· You feel increased pain or irritation when you urinate. °· You notice blood in your urine. °· You have questions about any medications that your health care provider recommended. °· You notice blood, pus, or swelling at the site of any test or treatment procedure. °· You develop a fever of more than 100.5°F (38.1°C). °SEEK IMMEDIATE MEDICAL CARE IF:  °You develop a fever of more than 102.0°F (38.9°C). °  °This information is not intended to replace advice given to you by your health care provider. Make sure you discuss any questions you have with your health care provider. °  °Document Released: 02/16/2009 Document Revised:   05/13/2014 Document Reviewed: 02/16/2009 °Elsevier Interactive Patient Education ©2016 [employer] ° °

## 2015-08-01 NOTE — ED Provider Notes (Signed)
CSN: [id number]     Arrival date & time 08/01/15  1850 History   First MD Initiated Contact with Patient 08/01/15 2030     Chief Complaint  Patient presents with  . Urinary Frequency    HPI   35 showed female presents today with complaints of urinary frequency. Patient reports that over the last 2 weeks she's had increased in urinary frequency. She reports that within  90 minutes she had greater than than 5 episodes of urination. She reports these were normal volume. Patient reports that she's been eating and drinking appropriately, does not feel thirsty, has not been drinking excessive water, no caffeine use. She notes that there is no smell, discharge, abdominal pain, dysuria, or any other concerning signs or symptoms other than the increased frequency.  Patient additionally notes that she's had intermittent episodes of blurred vision, none currently present, this has been present for roughly around the same time span. Patient denies any headache, weight loss, fevers, night sweats, or any other concerning signs or symptoms at this time. No abdominal back or flank pain noted, no fever or chills, nausea vomiting. Patient reports a history of gestational diabetes, currently blood sugar well under control.   Past Medical History  Diagnosis Date  . Cesarean delivery delivered   . Fibrocystic breast disease   . Headache(784.0)     menstrual  . Seizures ([place])   . Epilepsy [facility])    Past Surgical History  Procedure Laterality Date  . D & c  2006, 2011  . Hysteroscopy  2004  . Laparoscopy  2004  . Dilation and evacuation  08/16/2011    Procedure: DILATATION AND EVACUATION;  Surgeon: Alwyn Pea, MD;  Location: [facility];  Service: Gynecology;  Laterality: N/A;  . Cesarean section    . Dilation and curettage of uterus     Family History  Problem Relation Age of Onset  . Heart attack Father   . Hypertension Maternal Grandmother    Social History  Substance Use Topics  . Smoking status:  Never Smoker   . Smokeless tobacco: Never Used  . Alcohol Use: No   OB History    Gravida Para Term Preterm AB TAB SAB Ectopic Multiple Living   5 3        1      Review of Systems  All other systems reviewed and are negative.   Allergies  Aspirin and Lamictal  Home Medications   Prior to Admission medications   Medication Sig Start Date End Date Taking? Authorizing Provider  carbamazepine (CARBATROL) 300 MG 12 hr capsule Take 300 mg by mouth 2 (two) times daily.    Historical Provider, MD  guaiFENesin-codeine 100-10 MG/5ML syrup Take 5 mLs by mouth every 6 (six) hours as needed for cough. 02/23/15   Courteney Lyn Mackuen, MD  ibuprofen (ADVIL,MOTRIN) 200 MG tablet Take 400 mg by mouth every 6 (six) hours as needed for pain.    Historical Provider, MD  levETIRAcetam (KEPPRA) 500 MG tablet 1 in the am, 2 at hs Patient not taking: Reported on 04/11/2014 12/28/12   Dennie Bible, NP  metroNIDAZOLE (FLAGYL) 500 MG tablet Take 1 tablet (500 mg total) by mouth 3 (three) times daily. Patient not taking: Reported on 02/23/2015 05/30/14   Blanchie Dessert, MD  naproxen (NAPROSYN) 375 MG tablet Take 1 tablet (375 mg total) by mouth 2 (two) times daily. Patient not taking: Reported on 04/11/2014 12/26/12   April Palumbo, MD  ondansetron [facility]) 4 MG  tablet Take 1 tablet (4 mg total) by mouth every 6 (six) hours. Patient not taking: Reported on 02/23/2015 05/30/14   Blanchie Dessert, MD   BP 122/86 mmHg  Pulse 68  Temp(Src) 98.3 F (36.8 C) (Oral)  Resp 20  Ht 5' 10.5" (1.791 m)  Wt 86.183 kg  BMI 26.87 kg/m2  SpO2 100%  LMP 08/01/2015   Physical Exam  Constitutional: She is oriented to person, place, and time. She appears well-developed and well-nourished.  HENT:  Head: Normocephalic and atraumatic.  Eyes: Conjunctivae are normal. Pupils are equal, round, and reactive to light. Right eye exhibits no discharge. Left eye exhibits no discharge. No scleral icterus.  Neck: Normal  range of motion. No JVD present. No tracheal deviation present.  Pulmonary/Chest: Effort normal. No stridor.  Abdominal: Soft. She exhibits no distension and no mass. There is no tenderness. There is no rebound and no guarding.  Neurological: She is alert and oriented to person, place, and time. Coordination normal.  Skin: Skin is warm and dry. No rash noted. No erythema. No pallor.  Psychiatric: She has a normal mood and affect. Her behavior is normal. Judgment and thought content normal.  Nursing note and vitals reviewed.   ED Course  Procedures (including critical care time) Labs Review Labs Reviewed  URINALYSIS, ROUTINE W REFLEX MICROSCOPIC (NOT AT [facility])  PREGNANCY, URINE  CBG MONITORING, ED    Imaging Review No results found. I have personally reviewed and evaluated these images and lab results as part of my medical decision-making.   EKG Interpretation None      MDM   Final diagnoses:  Urinary frequency    Labs: Point care CBG, urinalysis, urine pregnant  Imaging:  Consults:  Therapeutics:  Discharge Meds:   Assessment/Plan: 35 year old female presents today with complaints of urinary frequency. She has no signs of infection thousand and frequency, she has no signs of infection on laboratory analysis. Uncertain etiology patient's urinary frequency, patient notes intermittent blurred vision. Stressed importance of primary care follow-up for further workup for urinary frequency in the setting of blurred vision. Patient has no signs or symptoms that would necessitate further evaluation or management here in the ED. She has close relationship with her primary care provider and neurologist. Patient assured her follow-up evaluation, she verbalized understanding and agreement to today's plan and understood short-term cautions.        Okey Regal, PA-C 08/01/15 2118  Sherwood Gambler, MD 08/03/15 [phone]

## 2015-08-01 NOTE — ED Notes (Signed)
Pt verbalizes understanding of d/c instructions and denies any further needs at this time. 

## 2015-08-01 NOTE — ED Notes (Signed)
Urine frequency x 2 weeks.

## 2015-10-12 ENCOUNTER — Other Ambulatory Visit: Payer: Self-pay | Admitting: Obstetrics and Gynecology

## 2015-11-02 ENCOUNTER — Other Ambulatory Visit: Payer: Self-pay | Admitting: Obstetrics and Gynecology

## 2016-06-25 ENCOUNTER — Encounter (HOSPITAL_BASED_OUTPATIENT_CLINIC_OR_DEPARTMENT_OTHER): Payer: Self-pay | Admitting: *Deleted

## 2016-06-25 ENCOUNTER — Emergency Department (HOSPITAL_BASED_OUTPATIENT_CLINIC_OR_DEPARTMENT_OTHER)
Admission: EM | Admit: 2016-06-25 | Discharge: 2016-06-25 | Disposition: A | Discharge status: Home / Self Care | Payer: BC Managed Care – PPO | Attending: Emergency Medicine | Admitting: Emergency Medicine

## 2016-06-25 DIAGNOSIS — J111 Influenza due to unidentified influenza virus with other respiratory manifestations: Secondary | ICD-10-CM

## 2016-06-25 DIAGNOSIS — R0981 Nasal congestion: Secondary | ICD-10-CM | POA: Diagnosis present

## 2016-06-25 DIAGNOSIS — J0100 Acute maxillary sinusitis, unspecified: Secondary | ICD-10-CM | POA: Insufficient documentation

## 2016-06-25 DIAGNOSIS — R69 Illness, unspecified: Secondary | ICD-10-CM

## 2016-06-25 LAB — INFLUENZA PANEL BY PCR (TYPE A & B)
INFLAPCR: NEGATIVE
Influenza B By PCR: NEGATIVE

## 2016-06-25 MED ORDER — AMOXICILLIN-POT CLAVULANATE 875-125 MG PO TABS: 1.0000 | ORAL_TABLET | Freq: Two times a day (BID) | ORAL | 0 refills | Status: AC

## 2016-06-25 MED FILL — AMOX-CLAV 875-125 MG TABLET: 875-125 | 5 days supply | Qty: 10 | Fill #0

## 2016-06-25 NOTE — Discharge Instructions (Signed)
Your newest symptoms are consistent with sinus infection. Most sinus infections are viral, however, you have specific signs of possible bacterial sinus infection. Please take all of your antibiotics until finished!   You may develop abdominal discomfort or diarrhea from the antibiotic.  You may help offset this with probiotics which you can buy or get in yogurt. Do not eat or take the probiotics until 2 hours after your antibiotic.   Your other symptoms are consistent with a viral illness, such as the flu. Viruses do not require antibiotics. Treatment is symptomatic care and it is important to note that these symptoms may last for 7-14 days. Symptoms will be intensified and complicated by dehydration. Dehydration can also extend the duration of symptoms. Drink plenty of fluids and get plenty of rest. You should be drinking at least half a liter of water an hour to stay hydrated. Electrolyte drinks are also encouraged. You should be drinking enough fluids to make your urine light yellow, almost clear. If this is not the case, you are not drinking enough water. Ibuprofen, Naproxen, or Tylenol for pain or fever. Plain Mucinex may help relieve congestion. Saline sinus rinses and saline nasal sprays may also help relieve congestion. Warm liquids or Chloraseptic spray may help soothe a sore throat. Follow up with a primary care provider, as needed, for any future management of this issue.

## 2016-06-25 NOTE — ED Provider Notes (Signed)
The patient had requested notification of her influenza swab results due to having a daughter with "immunocompromised neutropenia."   Results for orders placed or performed during the hospital encounter of 06/25/16  Influenza panel by PCR (type A & B)  Result Value Ref Range   Influenza A By PCR NEGATIVE NEGATIVE   Influenza B By PCR NEGATIVE NEGATIVE   No results found.   These results were called to the patient. The original plan of care was reiterated. Patient voices understanding.   Lorayne Bender, PA-C 06/25/16 1745    Duffy Bruce, MD 06/26/16 1003

## 2016-06-25 NOTE — ED Triage Notes (Signed)
Pt reports generalized body aches x Sunday, had nosebleed on Monday so she had to leave work early, took benadryl yesterday and slept most of the day, currently has headache and cont with body aches. Pt reports cough also

## 2016-06-25 NOTE — ED Provider Notes (Signed)
[facility] Provider Note   CSN: [id number] Arrival date & time: 06/25/16  [id number]     History   Chief Complaint Chief Complaint  Patient presents with  . Generalized Body Aches    HPI Paige Dunn is a 36 y.o. female.  HPI   Paige Dunn is a 36 y.o. female, with a history of Epilepsy, presenting to the ED with nasal congestion and facial pain. States, "I don't know if I caught a virus or if I am having an allergic reaction to cats." Two days ago she was in a house with cats. She endorses congestion, scratchy throat beginning that same day. She tried peppermint essential oils and benadryl with some relief. Beginning yesterday she endorses body aches, left sided facial pain, fever (Tmax 102 yesterday), and then thick, dark yellow drainage from the left nostril.  She took ibuprofen with relief in the fever. Her daughter recently had the flu. Has not had influenza vaccine.   Denies cough, SOB, CP, N/V, rashes, angioedema, or any other complaints.    Past Medical History:  Diagnosis Date  . Cesarean delivery delivered   . Epilepsy ([place])   . Fibrocystic breast disease   . Headache(784.0)    menstrual  . Seizures ([place])     Patient Active Problem List   Diagnosis Date Noted  . 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 Hemorrhoids 09/03/2011  . Spontaneous abortion 08/15/2011  . First trimester bleeding 08/15/2011    Past Surgical History:  Procedure Laterality Date  . CESAREAN SECTION    . d & c  2006, 2011  . DILATION AND CURETTAGE OF UTERUS    . DILATION AND EVACUATION  08/16/2011   Procedure: DILATATION AND EVACUATION;  Surgeon: Alwyn Pea, MD;  Location: [facility];  Service: Gynecology;  Laterality: N/A;  . HYSTEROSCOPY  2004  . LAPAROSCOPY  2004    OB History    Gravida Para Term Preterm AB Living   5 3       1    SAB TAB Ectopic Multiple Live Births           1       Home Medications    Prior to Admission medications     Medication Sig Start Date End Date Taking? Authorizing Provider  amoxicillin-clavulanate (AUGMENTIN) 875-125 MG tablet Take 1 tablet by mouth every 12 (twelve) hours. 06/25/16 06/30/16  Shawn C Joy, PA-C  carbamazepine (CARBATROL) 300 MG 12 hr capsule Take 300 mg by mouth 2 (two) times daily.    Historical Provider, MD  ibuprofen (ADVIL,MOTRIN) 200 MG tablet Take 400 mg by mouth every 6 (six) hours as needed for pain.    Historical Provider, MD    Family History Family History  Problem Relation Age of Onset  . Heart attack Father   . Hypertension Maternal Grandmother     Social History Social History  Substance Use Topics  . Smoking status: Never Smoker  . Smokeless tobacco: Never Used  . Alcohol use No     Allergies   Aspirin and Lamictal [lamotrigine]   Review of Systems Review of Systems  Constitutional: Positive for fever.  HENT: Positive for congestion, rhinorrhea and sinus pain. Negative for facial swelling and trouble swallowing.   Respiratory: Negative for shortness of breath.   Cardiovascular: Negative for chest pain.  Gastrointestinal: Negative for nausea and vomiting.  Musculoskeletal: Positive for myalgias.  All other systems reviewed and are negative.    Physical Exam  Updated Vital Signs BP 118/70 (BP Location: Right Arm)   Pulse 77   Temp 98.2 F (36.8 C) (Oral)   Resp 20   Ht 5' 10.5" (1.791 m)   Wt 83.9 kg   SpO2 99%   BMI 26.17 kg/m   Physical Exam  Constitutional: She appears well-developed and well-nourished. No distress.  HENT:  Head: Normocephalic and atraumatic.  Nose: Rhinorrhea present. Left sinus exhibits maxillary sinus tenderness and frontal sinus tenderness.  Nasal congestion.  Eyes: Conjunctivae are normal.  Neck: Neck supple.  Cardiovascular: Normal rate, regular rhythm, normal heart sounds and intact distal pulses.   Pulmonary/Chest: Effort normal and breath sounds normal. No respiratory distress.  Abdominal: Soft. There is  no tenderness. There is no guarding.  Musculoskeletal: She exhibits no edema.  Lymphadenopathy:    She has no cervical adenopathy.  Neurological: She is alert.  Skin: Skin is warm and dry. She is not diaphoretic.  Psychiatric: She has a normal mood and affect. Her behavior is normal.  Nursing note and vitals reviewed.    ED Treatments / Results  Labs (all labs ordered are listed, but only abnormal results are displayed) Labs Reviewed  INFLUENZA PANEL BY PCR (TYPE A & B)    EKG  EKG Interpretation None       Radiology No results found.  Procedures Procedures (including critical care time)  Medications Ordered in ED Medications - No data to display   Initial Impression / Assessment and Plan / ED Course  I have reviewed the triage vital signs and the nursing notes.  Pertinent labs & imaging results that were available during my care of the patient were reviewed by me and considered in my medical decision making (see chart for details).     Patient presents with initial symptoms of URI vs environmental allergies, but now with symptoms consistent with sinusitis with suspicion for bacterial sinusitis. No signs of sepsis. PCP follow up as needed. Return precautions discussed. Patient voices understanding of all instructions and is comfortable with discharge.    Patient states that her daughter is immunocompromise neutropenic. Requests influenza testing.    Vitals:   06/25/16 0910 06/25/16 0911 06/25/16 1111  BP: 118/70  105/68  Pulse: 77  69  Resp: 20  16  Temp: 98.2 F (36.8 C)    TempSrc: Oral    SpO2: 99%  100%  Weight:  83.9 kg   Height:  5' 10.5" (1.791 m)      Final Clinical Impressions(s) / ED Diagnoses   Final diagnoses:  Acute non-recurrent maxillary sinusitis  Influenza-like illness    New Prescriptions Discharge Medication List as of 06/25/2016 10:41 AM    START taking these medications   Details  amoxicillin-clavulanate (AUGMENTIN)  875-125 MG tablet Take 1 tablet by mouth every 12 (twelve) hours., Starting Tue 06/25/2016, Until Sun 06/30/2016, Print         Lorayne Bender, PA-C 06/26/16 [place], MD 06/26/16 1201

## 2016-08-25 ENCOUNTER — Emergency Department (HOSPITAL_BASED_OUTPATIENT_CLINIC_OR_DEPARTMENT_OTHER)
Admission: EM | Admit: 2016-08-25 | Discharge: 2016-08-25 | Disposition: A | Discharge status: Home / Self Care | Payer: BC Managed Care – PPO | Attending: Emergency Medicine | Admitting: Emergency Medicine

## 2016-08-25 ENCOUNTER — Emergency Department (HOSPITAL_BASED_OUTPATIENT_CLINIC_OR_DEPARTMENT_OTHER): Payer: BC Managed Care – PPO

## 2016-08-25 ENCOUNTER — Encounter (HOSPITAL_BASED_OUTPATIENT_CLINIC_OR_DEPARTMENT_OTHER): Payer: Self-pay | Admitting: Emergency Medicine

## 2016-08-25 DIAGNOSIS — R05 Cough: Secondary | ICD-10-CM | POA: Insufficient documentation

## 2016-08-25 DIAGNOSIS — R059 Cough, unspecified: Secondary | ICD-10-CM

## 2016-08-25 DIAGNOSIS — R072 Precordial pain: Secondary | ICD-10-CM

## 2016-08-25 LAB — CBC WITH DIFFERENTIAL/PLATELET
BASOS PCT: 0 %
Basophils Absolute: 0 10*3/uL (ref 0.0–0.1)
Eosinophils Absolute: 0.1 10*3/uL (ref 0.0–0.7)
Eosinophils Relative: 2 %
HCT: 34.9 % — ABNORMAL LOW (ref 36.0–46.0)
HEMOGLOBIN: 11.9 g/dL — AB (ref 12.0–15.0)
LYMPHS ABS: 2.4 10*3/uL (ref 0.7–4.0)
LYMPHS PCT: 32 %
MCH: 28.1 pg (ref 26.0–34.0)
MCHC: 34.1 g/dL (ref 30.0–36.0)
MCV: 82.5 fL (ref 78.0–100.0)
Monocytes Absolute: 0.8 10*3/uL (ref 0.1–1.0)
Monocytes Relative: 11 %
NEUTROS ABS: 4 10*3/uL (ref 1.7–7.7)
NEUTROS PCT: 55 %
Platelets: 373 10*3/uL (ref 150–400)
RBC: 4.23 MIL/uL (ref 3.87–5.11)
RDW: 12.4 % (ref 11.5–15.5)
WBC: 7.3 10*3/uL (ref 4.0–10.5)

## 2016-08-25 LAB — BASIC METABOLIC PANEL
Anion gap: 9 (ref 5–15)
BUN: 12 mg/dL (ref 6–20)
CHLORIDE: 103 mmol/L (ref 101–111)
CO2: 24 mmol/L (ref 22–32)
Calcium: 9 mg/dL (ref 8.9–10.3)
Creatinine, Ser: 0.68 mg/dL (ref 0.44–1.00)
GFR calc Af Amer: >60 mL/min (ref >60)
GFR calc non Af Amer: >60 mL/min (ref >60)
Glucose, Bld: 103 mg/dL — ABNORMAL HIGH (ref 65–99)
Potassium: 3.8 mmol/L (ref 3.5–5.1)
SODIUM: 136 mmol/L (ref 135–145)

## 2016-08-25 LAB — TROPONIN I: Troponin I: <0.03 ng/mL (ref <0.03)

## 2016-08-25 MED ORDER — AZITHROMYCIN 250 MG PO TABS: 250.0000 mg | ORAL_TABLET | Freq: Every day | ORAL | 0 refills | Status: DC

## 2016-08-25 MED ORDER — PREDNISONE 20 MG PO TABS: 40.0000 mg | ORAL_TABLET | Freq: Every day | ORAL | 0 refills | Status: AC

## 2016-08-25 MED ORDER — BENZONATATE 100 MG PO CAPS: 100.0000 mg | ORAL_CAPSULE | Freq: Three times a day (TID) | ORAL | 0 refills | Status: DC

## 2016-08-25 NOTE — Discharge Instructions (Signed)

## 2016-08-25 NOTE — ED Notes (Signed)
Pt reports dry cough x 2 weeks.

## 2016-08-25 NOTE — ED Provider Notes (Signed)
Emergency Department Provider Note  By signing my name below, I, Avnee Patel, attest that this documentation has been prepared under the direction and in the presence of Margette Fast, MD  Electronically Signed: Delton Pra[place], ED Scribe. 08/25/16. 10:06 PM.  I have reviewed the triage vital signs and the nursing notes.   HISTORY  Chief Complaint Cough   HPI Paige Dunn is a 36 y.o. female complaining of a moderate, persistent cough x 2.5 weeks. Pt also reports nasal congestion and constant chest discomfort which she describes as a tightness x yesterday. She has taken Benadryl and nasal spray with no relief. She has also used her son's inhaler with no relief. Pt denies any other associated symptoms. No other complaints noted at this time. No radiation. Symptoms are moderate and persistent.    Past Medical History:  Diagnosis Date  . Cesarean delivery delivered   . Epilepsy ([place])   . Fibrocystic breast disease   . Headache(784.0)    menstrual  . Seizures ([place])     Patient Active Problem List   Diagnosis Date Noted  . 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 Hemorrhoids 09/03/2011  . Spontaneous abortion 08/15/2011  . First trimester bleeding 08/15/2011    Past Surgical History:  Procedure Laterality Date  . CESAREAN SECTION    . d & c  2006, 2011  . DILATION AND CURETTAGE OF UTERUS    . DILATION AND EVACUATION  08/16/2011   Procedure: DILATATION AND EVACUATION;  Surgeon: Alwyn Pea, MD;  Location: [facility];  Service: Gynecology;  Laterality: N/A;  . HYSTEROSCOPY  2004  . LAPAROSCOPY  2004    Current Outpatient Rx  . Order #: [id number] Class: Print  . Order #: [id number] Class: Print  . Order #: [id number] Class: Historical Med  . Order #: [id number] Class: Historical Med  . Order #: [id number] Class: Print    Allergies Aspirin and Lamictal [lamotrigine]  Family History  Problem Relation Age of Onset  . Heart attack Father   . Hypertension  Maternal Grandmother     Social History Social History  Substance Use Topics  . Smoking status: Never Smoker  . Smokeless tobacco: Never Used  . Alcohol use No    Review of Systems  Constitutional: No fever/chills Eyes: No visual changes. ENT: No sore throat. Cardiovascular: + chest pain. Respiratory: Denies shortness of breath. Gastrointestinal: No abdominal pain.  No nausea, no vomiting.  No diarrhea.  No constipation. Genitourinary: Negative for dysuria. Musculoskeletal: Negative for back pain. Skin: Negative for rash. Neurological: Negative for headaches, focal weakness or numbness.  10-point ROS otherwise negative.  ____________________________________________   PHYSICAL EXAM:  VITAL SIGNS: ED Triage Vitals  Enc Vitals Group     BP 08/25/16 2148 (!) 108/97     Pulse Rate 08/25/16 2148 97     Resp 08/25/16 2148 (!) 22     Temp 08/25/16 2148 98.8 F (37.1 C)     Temp Source 08/25/16 2148 Oral     SpO2 08/25/16 2148 100 %     Weight 08/25/16 2141 192 lb (87.1 kg)     Height 08/25/16 2141 5' 10.5" (1.791 m)     Pain Score 08/25/16 2141 4   Constitutional: Alert and oriented. Well appearing and in no acute distress. Eyes: Conjunctivae are normal.  Head: Atraumatic. Nose: No congestion/rhinnorhea. Mouth/Throat: Mucous membranes are moist.  Oropharynx non-erythematous. Neck: No stridor.  Cardiovascular: Normal rate, regular rhythm. Good peripheral circulation. Grossly normal heart sounds.  Respiratory: Normal respiratory effort.  No retractions. Lungs with faint expiratory wheezing.  Gastrointestinal: Soft and nontender. No distention.  Musculoskeletal: No lower extremity tenderness nor edema. No gross deformities of extremities. Neurologic:  Normal speech and language. No gross focal neurologic deficits are appreciated.  Skin:  Skin is warm, dry and intact. No rash noted. Psychiatric: Mood and affect are normal. Speech and behavior are  normal.  ____________________________________________   LABS (all labs ordered are listed, but only abnormal results are displayed)  Labs Reviewed  BASIC METABOLIC PANEL - Abnormal; Notable for the following:       Result Value   Glucose, Bld 103 (*)    All other components within normal limits  CBC WITH DIFFERENTIAL/PLATELET - Abnormal; Notable for the following:    Hemoglobin 11.9 (*)    HCT 34.9 (*)    All other components within normal limits  TROPONIN I   ____________________________________________  RADIOLOGY  Dg Chest 2 View  Result Date: 08/25/2016 CLINICAL DATA:  Cough and wheeze with chest tightness x3 weeks EXAM: CHEST  2 VIEW COMPARISON:  02/23/2015 FINDINGS: The heart size and mediastinal contours are within normal limits. Both lungs are clear. The visualized skeletal structures are unremarkable. IMPRESSION: No active cardiopulmonary disease. Electronically Signed   By: Ashley Royalty M.D.   On: 08/25/2016 22:53    ____________________________________________   PROCEDURES  Procedure(s) performed:   Procedures  None ____________________________________________   INITIAL IMPRESSION / ASSESSMENT AND PLAN / ED COURSE  Pertinent labs & imaging results that were available during my care of the patient were reviewed by me and considered in my medical decision making (see chart for details).  Patient presents to the ED with cough and URI symptoms. Normal CXR. Chest discomfort constantly since yesterday. Troponin negative. Normal EKG. Low PE suspicion. PERC negative. Plan for supportive care and symptom mgmt at home. Will follow with PCP.   At this time, I do not feel there is any life-threatening condition present. I have reviewed and discussed all results (EKG, imaging, lab, urine as appropriate), exam findings with patient. I have reviewed nursing notes and appropriate previous records.  I feel the patient is safe to be discharged home without further emergent  workup. Discussed usual and customary return precautions. Patient and family (if present) verbalize understanding and are comfortable with this plan.  Patient will follow-up with their primary care provider. If they do not have a primary care provider, information for follow-up has been provided to them. All questions have been answered.   ____________________________________________  FINAL CLINICAL IMPRESSION(S) / ED DIAGNOSES  Final diagnoses:  Cough  Precordial chest pain     MEDICATIONS GIVEN DURING THIS VISIT:  Medications - No data to display   NEW OUTPATIENT MEDICATIONS STARTED DURING THIS VISIT:  Discharge Medication List as of 08/25/2016 11:00 PM    START taking these medications   Details  azithromycin (ZITHROMAX) 250 MG tablet Take 1 tablet (250 mg total) by mouth daily. Take first 2 tablets together, then 1 every day until finished., Starting Sun 08/25/2016, Print    benzonatate (TESSALON) 100 MG capsule Take 1 capsule (100 mg total) by mouth every 8 (eight) hours., Starting Sun 08/25/2016, Print    predniSONE (DELTASONE) 20 MG tablet Take 2 tablets (40 mg total) by mouth daily., Starting Sun 08/25/2016, Until Fri 08/30/2016, Print        Note:  This document was prepared using Dragon voice recognition software and may include unintentional dictation errors.  Nanda Quinton,  MD Emergency Medicine  I personally performed the services described in this documentation, which was scribed in my presence. The recorded information has been reviewed and is accurate.       Margette Fast, MD 08/26/16 [phone]

## 2016-08-25 NOTE — ED Triage Notes (Signed)
Patient reports that she has has this strong unproductive cough x 2 weeks. She has been unable to get into her MD - the patient reports that she came in tonight because her chest was starting to get tight. Patient has a strong and forceful cough that she moves her whole body with when she coughs, continuously in triage. The cough is non productive. Patient also has noted nasal congested and sniffs forcefully through her nose when she finishes coughing. This is a continues cycle throughout the triage process

## 2016-08-25 NOTE — ED Notes (Signed)
Pt given d/c instructions as per chart. rx x 3. Verbalizes understanding. No questions.

## 2017-01-09 ENCOUNTER — Encounter (HOSPITAL_COMMUNITY): Payer: Self-pay | Admitting: Nurse Practitioner

## 2017-01-09 ENCOUNTER — Ambulatory Visit (HOSPITAL_COMMUNITY)
Admission: EM | Admit: 2017-01-09 | Discharge: 2017-01-09 | Disposition: A | Discharge status: Home / Self Care | Payer: Federal, State, Local not specified - PPO | Attending: Internal Medicine | Admitting: Internal Medicine

## 2017-01-09 DIAGNOSIS — R030 Elevated blood-pressure reading, without diagnosis of hypertension: Secondary | ICD-10-CM

## 2017-01-09 DIAGNOSIS — Z3202 Encounter for pregnancy test, result negative: Secondary | ICD-10-CM | POA: Diagnosis not present

## 2017-01-09 DIAGNOSIS — R5383 Other fatigue: Secondary | ICD-10-CM | POA: Diagnosis not present

## 2017-01-09 DIAGNOSIS — R202 Paresthesia of skin: Secondary | ICD-10-CM | POA: Diagnosis not present

## 2017-01-09 LAB — POCT I-STAT, CHEM 8
BUN: 13 mg/dL (ref 6–20)
CREATININE: 0.8 mg/dL (ref 0.44–1.00)
Calcium, Ion: 1.21 mmol/L (ref 1.15–1.40)
Chloride: 102 mmol/L (ref 101–111)
GLUCOSE: 89 mg/dL (ref 65–99)
HEMATOCRIT: 35 % — AB (ref 36.0–46.0)
Hemoglobin: 11.9 g/dL — ABNORMAL LOW (ref 12.0–15.0)
POTASSIUM: 4.1 mmol/L (ref 3.5–5.1)
Sodium: 138 mmol/L (ref 135–145)
TCO2: 28 mmol/L (ref 22–32)

## 2017-01-09 LAB — POCT URINALYSIS DIP (DEVICE)
BILIRUBIN URINE: NEGATIVE
Glucose, UA: NEGATIVE mg/dL
KETONES UR: NEGATIVE mg/dL
Leukocytes, UA: NEGATIVE
Nitrite: NEGATIVE
PH: 7 (ref 5.0–8.0)
PROTEIN: NEGATIVE mg/dL
Specific Gravity, Urine: 1.02 (ref 1.005–1.030)
Urobilinogen, UA: 1 mg/dL (ref 0.0–1.0)

## 2017-01-09 LAB — POCT PREGNANCY, URINE: Preg Test, Ur: NEGATIVE

## 2017-01-09 NOTE — ED Triage Notes (Signed)
Pt presents with c/o hypertension. Her blood pressure was elevated last week and again this week when she checked it at home. She denies any history of high blood pressure. she's been check in her blood pressure because she has not felt well for about one week. she reports malaise, headaches, intermittent tingling sensation in her hands and feet.

## 2017-01-09 NOTE — Discharge Instructions (Signed)
The significance of the numbness and tingling is unclear at this time as is the reason for lethargy. You may need additional testing such as thyroid, B12, more specific complete blood count data and other testing. I do not think the blood pressure numbers that you have quoted so far are especially of great concern at this time your blood pressure is normal in the urgent care and does fluctuate. It may be a good idea to take it with the same blood pressure cuff at different times of the day and write it down. For any worsening, new symptoms or problems that seem emergent he may need to go to the emergency department. Otherwise, follow-up with your primary care provider.

## 2017-01-09 NOTE — ED Provider Notes (Signed)
[facility]    CSN: [id number] Arrival date & time: 01/09/17  1830     History   Chief Complaint Chief Complaint  Patient presents with  . Hypertension    HPI Paige Dunn is a 36 y.o. female.   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 This numbness occurs about 5-7 times a day and lasts for approximately 10 minutes at a time. She is also concerned about diastolic blood pressure of 95. In the urgent care is 118/74. Her vital signs are normal. She is fully awake alert and oriented showing no signs of distress or acute illness. States she tried to call PCP and unable to get an appointment.      Past Medical History:  Diagnosis Date  . Cesarean delivery delivered   . Epilepsy ([place])   . Fibrocystic breast disease   . Headache(784.0)    menstrual  . Seizures ([place])     Patient Active Problem List   Diagnosis Date Noted  . 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 Hemorrhoids 09/03/2011  . Spontaneous abortion 08/15/2011  . First trimester bleeding 08/15/2011    Past Surgical History:  Procedure Laterality Date  . CESAREAN SECTION    . d & c  2006, 2011  . DILATION AND CURETTAGE OF UTERUS    . DILATION AND EVACUATION  08/16/2011   Procedure: DILATATION AND EVACUATION;  Surgeon: Alwyn Pea, MD;  Location: [facility];  Service: Gynecology;  Laterality: N/A;  . HYSTEROSCOPY  2004  . LAPAROSCOPY  2004    OB History    Gravida Para Term Preterm AB Living   5 3       1    SAB TAB Ectopic Multiple Live Births           1       Home Medications    Prior to Admission medications   Medication Sig Start Date End Date Taking? Authorizing Provider  carbamazepine (CARBATROL) 300 MG 12 hr capsule Take 300 mg by mouth 2 (two) times daily.    [provider]  ibuprofen  (ADVIL,MOTRIN) 200 MG tablet Take 400 mg by mouth every 6 (six) hours as needed for pain.    [provider]    Family History Family History  Problem Relation Age of Onset  . Heart attack Father   . Hypertension Maternal Grandmother     Social History Social History  Substance Use Topics  . Smoking status: Never Smoker  . Smokeless tobacco: Never Used  . Alcohol use No     Allergies   Aspirin and Lamictal [lamotrigine]   Review of Systems Review of Systems  Constitutional: Positive for activity change and fatigue. Negative for fever.  HENT: Negative.   Respiratory: Negative.   Gastrointestinal: Negative.   Genitourinary:       Discolored  urine  Musculoskeletal: Negative.   Skin: Negative.   Neurological: Positive for numbness.       Pressure in the head but no headache.  Psychiatric/Behavioral: Negative.   All other systems reviewed and are negative.    Physical Exam Triage Vital Signs ED Triage Vitals  Enc Vitals Group     BP 01/09/17 1906 115/74     Pulse Rate 01/09/17 1906 75     Resp 01/09/17 1906 16     Temp  01/09/17 1906 98.1 F (36.7 C)     Temp Source 01/09/17 1906 Oral     SpO2 01/09/17 1906 100 %     Weight --      Height --      Head Circumference --      Peak Flow --      Pain Score 01/09/17 1908 2     Pain Loc --      Pain Edu? --      Excl. in [place]? --    No data found.   Updated Vital Signs BP 115/74   Pulse 75   Temp 98.1 F (36.7 C) (Oral)   Resp 16   SpO2 100%   Visual Acuity Right Eye Distance:   Left Eye Distance:   Bilateral Distance:    Right Eye Near:   Left Eye Near:    Bilateral Near:     Physical Exam  Constitutional: She is oriented to person, place, and time. She appears well-developed and well-nourished. No distress.  HENT:  Head: Normocephalic and atraumatic.  Mouth/Throat: No oropharyngeal exudate.  Eyes: Pupils are equal, round, and reactive to light. Conjunctivae and EOM are normal.  Neck:  Normal range of motion. Neck supple. No tracheal deviation present.  Cardiovascular: Normal rate, regular rhythm, normal heart sounds and intact distal pulses.   Pulmonary/Chest: Effort normal and breath sounds normal. No respiratory distress. She has no wheezes. She has no rales.  Abdominal: Soft. She exhibits no distension and no mass. There is no tenderness. There is no rebound and no guarding.  Musculoskeletal: Normal range of motion. She exhibits no edema or tenderness.  Lymphadenopathy:    She has no cervical adenopathy.  Neurological: She is alert and oriented to person, place, and time. No cranial nerve deficit.  Skin: Skin is warm and dry. Capillary refill takes less than 2 seconds. No rash noted.  Psychiatric: She has a normal mood and affect.  Nursing note and vitals reviewed.    UC Treatments / Results  Labs (all labs ordered are listed, but only abnormal results are displayed) Labs Reviewed  POCT I-STAT, CHEM 8 - Abnormal; Notable for the following:       Result Value   Hemoglobin 11.9 (*)    HCT 35.0 (*)    All other components within normal limits  POCT URINALYSIS DIP (DEVICE) - Abnormal; Notable for the following:    Hgb urine dipstick TRACE (*)    All other components within normal limits  POCT PREGNANCY, URINE    EKG  EKG Interpretation None       Radiology No results found.  Procedures Procedures (including critical care time)  Medications Ordered in UC Medications - No data to display   Initial Impression / Assessment and Plan / UC Course  I have reviewed the triage vital signs and the nursing notes.  Pertinent labs & imaging results that were available during my care of the patient were reviewed by me and considered in my medical decision making (see chart for details).     The significance of the numbness and tingling is unclear at this time as is the reason for lethargy. You may need additional testing such as thyroid, B12, more specific  complete blood count data and other testing. I do not think the blood pressure numbers that you have quoted so far are especially of great concern at this time your blood pressure is normal in the urgent care and does fluctuate. It may be a  good idea to take it with the same blood pressure cuff at different times of the day and write it down. For any worsening, new symptoms or problems that seem emergent he may need to go to the emergency department. Otherwise, follow-up with your primary care provider.   Final Clinical Impressions(s) / UC Diagnoses   Final diagnoses:  Paresthesia  Elevated blood pressure, situational    New Prescriptions Current Discharge Medication List       Controlled Substance Prescriptions [place] Controlled Substance Registry consulted? Not Applicable   Janne Napoleon, NP 01/09/17 2021

## 2017-08-05 LAB — HIV ANTIGEN ANTIBODY CARE EVERYWHERE: HIV 1/2 PLUS O AG/AB SCREEN CARE EVERYWHERE: NONREACTIVE — NL

## 2017-09-22 LAB — ABO/RH CARE EVERYWHERE: ABO/RH CARE EVERYWHERE: A POS — NL

## 2017-09-22 LAB — TYPE AND SCREEN CARE EVERYWHERE
ABO/RH CARE EVERYWHERE: A POS — NL
ANTIBODY SCREEN CARE EVERYWHERE: NEGATIVE — NL

## 2018-04-30 ENCOUNTER — Ambulatory Visit (INDEPENDENT_AMBULATORY_CARE_PROVIDER_SITE_OTHER): Payer: BC Managed Care – PPO | Admitting: Family Medicine

## 2018-04-30 ENCOUNTER — Ambulatory Visit: Payer: BC Managed Care – PPO

## 2018-04-30 ENCOUNTER — Encounter: Payer: Self-pay | Admitting: Family Medicine

## 2018-04-30 VITALS — BP 138/77 | HR 84 | Resp 17 | Ht 69.0 in | Wt 184.2 lb

## 2018-04-30 DIAGNOSIS — M25552 Pain in left hip: Secondary | ICD-10-CM | POA: Diagnosis not present

## 2018-04-30 DIAGNOSIS — M5432 Sciatica, left side: Secondary | ICD-10-CM | POA: Diagnosis not present

## 2018-04-30 DIAGNOSIS — Z7689 Persons encountering health services in other specified circumstances: Secondary | ICD-10-CM | POA: Diagnosis not present

## 2018-04-30 MED ORDER — CYCLOBENZAPRINE HCL 5 MG PO TABS: 5.0000 mg | ORAL_TABLET | Freq: Three times a day (TID) | ORAL | 1 refills | Status: DC | PRN

## 2018-04-30 MED ORDER — PREDNISONE 20 MG PO TABS: ORAL_TABLET | ORAL | 0 refills | Status: DC

## 2018-04-30 NOTE — Progress Notes (Signed)
Paige Dunn, is a 37 y.o. female  GEX:[id number]  WNU:[id number]  DOB - 22-Oct-1980  CC:  Chief Complaint  Patient presents with  . Establish Care  . Hip Pain    L hip pain x months. no known injury. pain worsens with movement & touch       HPI: Paige Dunn is a 37 y.o. female is here today to establish care.   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    Today's visit:  Left hip pain ongoing for several month. Denies injury. Pain is present with positional movements and laying laterally in bed. Pain has remained unchanged and unresponsive to ibuprofen. She has a history of sciatica with one episode severity requiring multiple weeks in physical therapy.  Pain is experienced with left leg extension and internal rotation.  Pain is also experienced while lying on her left side. She denies hearing any joint cracking when walking.  She endorses no routine physical activity. Her current Body mass index is 27.2 kg/m. No history of chronic joint pain, fibromyalgia, or back pain.   Current medications: Current Outpatient Medications:  .  ibuprofen (ADVIL,MOTRIN) 200 MG tablet, Take 400 mg by mouth every 6 (six) hours as needed for pain., Disp: , Rfl:    Pertinent family medical history: family history includes Heart attack in her father; Hypertension in her maternal grandmother.   Allergies  Allergen Reactions  . Aspirin Nausea And Vomiting and Other (See Comments)    Tremors, dizziness, upset stomach  . Lamictal [Lamotrigine]     Neuromuscular discomfort    Social History   Socioeconomic History  . Marital status: Married    Spouse name: Not on file  . Number of children: Not on file  . Years of education: Not on file  . Highest education level: Not on file  Occupational History  . Not on file  Social Needs  . Financial resource strain: Not on file  . Food insecurity:    Worry: Not on file    Inability: Not on  file  . Transportation needs:    Medical: Not on file    Non-medical: Not on file  Tobacco Use  . Smoking status: Never Smoker  . Smokeless tobacco: Never Used  Substance and Sexual Activity  . Alcohol use: No  . Drug use: No  . Sexual activity: Not on file  Lifestyle  . Physical activity:    Days per week: Not on file    Minutes per session: Not on file  . Stress: Not on file  Relationships  . Social connections:    Talks on phone: Not on file    Gets together: Not on file    Attends religious service: Not on file    Active member of club or organization: Not on file    Attends meetings of clubs or organizations: Not on file    Relationship status: Not on file  . Intimate partner violence:    Fear of current or ex partner: Not on file    Emotionally abused: Not on file    Physically abused: Not on file    Forced sexual activity: Not on file  Other Topics Concern  . Not on file  Social History Narrative   Patient is married and lives with her husband.    Patient has 2 children.     Review of Systems: Constitutional: Negative for fever, chills, diaphoresis, activity change, appetite change and fatigue.  Respiratory: Negative for cough, choking, chest tightness, shortness of breath, wheezing and stridor.  Cardiovascular: Negative for chest pain, palpitations and leg swelling. Genitourinary: Negative for dysuria, urgency, frequency, hematuria, flank pain, decreased urine volume, difficulty urinating. Musculoskeletal: See HPI  Neurological: Negative for dizziness, tremors, seizures, syncope, facial asymmetry, speech difficulty, weakness, light-headedness, numbness and headaches.    Objective:   Vitals:   04/30/18 1341  BP: 138/77  Pulse: 84  Resp: 17  SpO2: 96%    BP Readings from Last 3 Encounters:  04/30/18 138/77  01/09/17 115/74  08/25/16 115/73    Filed Weights   04/30/18 1341  Weight: 184 lb 3.2 oz (83.6 kg)      Physical Exam: Constitutional:  Patient appears well-developed and well-nourished. No distress. HENT: Normocephalic, atraumatic, External right and left ear normal. Oropharynx is clear and moist.  Eyes: Conjunctivae and EOM are normal. PERRLA, no scleral icterus. Neck: Normal ROM. Neck supple. No JVD. No tracheal deviation. No thyromegaly. CVS: RRR, S1/S2 +, no murmurs, no gallops, no carotid bruit.  Pulmonary: Effort and breath sounds normal, no stridor, rhonchi, wheezes, rales.  Abdominal: Soft. BS +, no distension, tenderness, rebound or guarding.  Musculoskeletal: Left Hip: palpable tenderness left lateral sacral region. +tenderness with left leg extension, adduction and abduction. Negative crepitus. No edema and no tenderness.  Neuro: Alert. Normal muscle tone coordination. Normal gait. BUE and BLE strength 5/5. Bilateral hand grips symmetrical. Skin: Skin is warm and dry. No rash noted. Not diaphoretic. No erythema. No pallor. Psychiatric: Normal mood and affect. Behavior, judgment, thought content normal.  Lab Results (prior encounters)  Lab Results  Component Value Date   WBC 7.3 08/25/2016   HGB 11.9 (L) 01/09/2017   HCT 35.0 (L) 01/09/2017   MCV 82.5 08/25/2016   PLT 373 08/25/2016   Lab Results  Component Value Date   CREATININE 0.80 01/09/2017   BUN 13 01/09/2017   NA 138 01/09/2017   K 4.1 01/09/2017   CL 102 01/09/2017   CO2 24 08/25/2016      Assessment and plan:  1. Encounter to establish care  2. Left hip pain  -DG HIP UNILAT WITH PELVIS 2-3 VIEWS LEFT Dg Hip Unilat With Pelvis 2-3 Views Left  Result Date: 04/30/2018 CLINICAL DATA:  Left hip pain EXAM: DG HIP (WITH OR WITHOUT PELVIS) 2-3V LEFT COMPARISON:  None. FINDINGS: There is no evidence of hip fracture or dislocation. There is no evidence of arthropathy or other focal bone abnormality. Hip joints and SI joints are symmetric and unremarkable. IMPRESSION: Negative. Electronically Signed   By: Rolm Baptise M.D.   On: 04/30/2018 14:20  3.  Sciatica of left side -Ambulatory referral to Physical Therapy   Imaging negative of any acute or chronic changes, therefore pain is likely a reoccurrence of sciatica. Will trial prednisone taper, treat pain as needed with cyclobenzaprine, and refer patient for physical therapy.   Meds ordered this encounter  Medications  . predniSONE (DELTASONE) 20 MG tablet    Sig: Take 3 PO QAM x3days, 2 PO QAM x3days, 1 PO QAM x3days    Dispense:  18 tablet    Refill:  0  . cyclobenzaprine (FLEXERIL) 5 MG tablet    Sig: Take 1 tablet (5 mg total) by mouth 3 (three) times daily as needed for muscle spasms.    Dispense:  30 tablet    Refill:  1    Orders Placed This Encounter  Procedures  . DG HIP UNILAT WITH PELVIS 2-3  VIEWS LEFT  . Ambulatory referral to Physical Therapy     Return for follow-up 6 weeks and schedule CPE   A total of 30 minutes spent, greater than 50 % of this time was spent obtaining history, patient is new, counseling and coordination of care.  The patient was given clear instructions to go to ER or return to medical center if symptoms don't improve, worsen or new problems develop. The patient verbalized understanding. The patient was advised  to call and obtain lab results if they haven't heard anything from out office within 7-10 business days.  Molli Barrows, FNP Primary Care at [facility] [address], [place] [address] [phone]: [phone]    This note has been created with Dragon speech recognition software and Engineer, materials. Any transcriptional errors are unintentional.

## 2018-04-30 NOTE — Patient Instructions (Addendum)
Thank you for choosing Primary Care at [facility] to be your medical home!    Paige Dunn was seen by Molli Barrows, FNP today.   Burgess Amor Holliman's primary care provider is Glendale Chard, MD.   For the best care possible, you should try to see Molli Barrows, FNP-C whenever you come to the clinic.   We look forward to seeing you again soon!  If you have any questions about your visit today, please call us at [phone] or feel free to reach your primary care provider via [facility].          Sciatica  Sciatica is pain, numbness, weakness, or tingling along your sciatic nerve. The sciatic nerve starts in the lower back and goes down the back of each leg. Sciatica happens when this nerve is pinched or has pressure put on it. Sciatica usually goes away on its own or with treatment. Sometimes, sciatica may keep coming back (recur). Follow these instructions at home: Medicines  Take over-the-counter and prescription medicines only as told by your doctor.  Do not drive or use heavy machinery while taking prescription pain medicine. Managing pain  If directed, put ice on the affected area. ? Put ice in a plastic bag. ? Place a towel between your skin and the bag. ? Leave the ice on for 20 minutes, 2-3 times a day.  After icing, apply heat to the affected area before you exercise or as often as told by your doctor. Use the heat source that your doctor tells you to use, such as a moist heat pack or a heating pad. ? Place a towel between your skin and the heat source. ? Leave the heat on for 20-30 minutes. ? Remove the heat if your skin turns bright red. This is especially important if you are unable to feel pain, heat, or cold. You may have a greater risk of getting burned. Activity  Return to your normal activities as told by your doctor. Ask your doctor what activities are safe for you. ? Avoid activities that make your sciatica worse.  Take short rests during the day. Rest  in a lying or standing position. This is usually better than sitting to rest. ? When you rest for a long time, do some physical activity or stretching between periods of rest. ? Avoid sitting for a long time without moving. Get up and move around at least one time each hour.  Exercise and stretch regularly, as told by your doctor.  Do not lift anything that is heavier than 10 lb (4.5 kg) while you have symptoms of sciatica. ? Avoid lifting heavy things even when you do not have symptoms. ? Avoid lifting heavy things over and over.  When you lift objects, always lift in a way that is safe for your body. To do this, you should: ? Bend your knees. ? Keep the object close to your body. ? Avoid twisting. General instructions  Use good posture. ? Avoid leaning forward when you are sitting. ? Avoid hunching over when you are standing.  Stay at a healthy weight.  Wear comfortable shoes that support your feet. Avoid wearing high heels.  Avoid sleeping on a mattress that is too soft or too hard. You might have less pain if you sleep on a mattress that is firm enough to support your back.  Keep all follow-up visits as told by your doctor. This is important. Contact a doctor if:  You have pain that: ? Wakes you  up when you are sleeping. ? Gets worse when you lie down. ? Is worse than the pain you have had in the past. ? Lasts longer than 4 weeks.  You lose weight for without trying. Get help right away if:  You cannot control when you pee (urinate) or poop (have a bowel movement).  You have weakness in any of these areas and it gets worse. ? Lower back. ? Lower belly (pelvis). ? Butt (buttocks). ? Legs.  You have redness or swelling of your back.  You have a burning feeling when you pee. This information is not intended to replace advice given to you by your health care provider. Make sure you discuss any questions you have with your health care provider. Document Released:  01/30/2008 Document Revised: 09/28/2015 Document Reviewed: 12/30/2014 Elsevier Interactive Patient Education  2019 [employer].

## 2018-05-01 ENCOUNTER — Telehealth: Payer: Self-pay | Admitting: Family Medicine

## 2018-05-01 DIAGNOSIS — M5432 Sciatica, left side: Secondary | ICD-10-CM | POA: Insufficient documentation

## 2018-05-01 NOTE — Telephone Encounter (Signed)
Referral sent to [facility].

## 2018-05-01 NOTE — Telephone Encounter (Signed)
Please fax referral to [facility]

## 2018-05-20 ENCOUNTER — Telehealth: Payer: Self-pay

## 2018-05-20 NOTE — Telephone Encounter (Signed)
-----   Message from Scot Jun, [place] sent at 05/18/2018 11:13 AM EST ----- Regarding: FW: Pt Referral I will have Jeanette Caprice reach out to patient! Thanks for letting me know. We will also send a letter.   Jodeen Mclin-please see message from Alinda Sierras and make an effort to reach patient. If no answer, send letter requesting patient to notify us here in office. Please document attempt efforts.  Molli Barrows, FNP ----- Message ----- From: Ena Dawley Sent: 05/18/2018  10:28 AM EST To: Scot Jun, FNP Subject: Pt Referral                                    Good Morning  I just wanted to let you know that   [facility] PT is been leaving several messages  With no return call from patient   Have a Nice Day .

## 2018-05-20 NOTE — Telephone Encounter (Signed)
Left voicemail to follow up on referral.

## 2018-05-25 ENCOUNTER — Other Ambulatory Visit (HOSPITAL_COMMUNITY)
Admission: RE | Admit: 2018-05-25 | Discharge: 2018-05-25 | Disposition: A | Discharge status: Home / Self Care | Payer: BC Managed Care – PPO | Source: Ambulatory Visit | Attending: Family Medicine | Admitting: Family Medicine

## 2018-05-25 ENCOUNTER — Ambulatory Visit (INDEPENDENT_AMBULATORY_CARE_PROVIDER_SITE_OTHER): Payer: BC Managed Care – PPO | Admitting: Family Medicine

## 2018-05-25 ENCOUNTER — Encounter: Payer: Self-pay | Admitting: Family Medicine

## 2018-05-25 VITALS — BP 127/82 | HR 71 | Resp 16 | Ht 69.0 in | Wt 184.0 lb

## 2018-05-25 DIAGNOSIS — Z124 Encounter for screening for malignant neoplasm of cervix: Secondary | ICD-10-CM | POA: Diagnosis present

## 2018-05-25 DIAGNOSIS — Z130 Encounter for screening for diseases of the blood and blood-forming organs and certain disorders involving the immune mechanism: Secondary | ICD-10-CM

## 2018-05-25 DIAGNOSIS — Z131 Encounter for screening for diabetes mellitus: Secondary | ICD-10-CM | POA: Diagnosis not present

## 2018-05-25 DIAGNOSIS — Z1239 Encounter for other screening for malignant neoplasm of breast: Secondary | ICD-10-CM

## 2018-05-25 DIAGNOSIS — Z1389 Encounter for screening for other disorder: Secondary | ICD-10-CM | POA: Diagnosis not present

## 2018-05-25 DIAGNOSIS — Z0000 Encounter for general adult medical examination without abnormal findings: Secondary | ICD-10-CM

## 2018-05-25 DIAGNOSIS — Z1329 Encounter for screening for other suspected endocrine disorder: Secondary | ICD-10-CM | POA: Diagnosis not present

## 2018-05-25 DIAGNOSIS — Z13220 Encounter for screening for lipoid disorders: Secondary | ICD-10-CM

## 2018-05-25 DIAGNOSIS — Z114 Encounter for screening for human immunodeficiency virus [HIV]: Secondary | ICD-10-CM | POA: Diagnosis not present

## 2018-05-25 LAB — POCT URINALYSIS DIP (CLINITEK)
BILIRUBIN UA: NEGATIVE mg/dL
Bilirubin, UA: NEGATIVE
Glucose, UA: NEGATIVE mg/dL
NITRITE UA: NEGATIVE
PH UA: 5.5 (ref 5.0–8.0)
POC PROTEIN,UA: NEGATIVE
Spec Grav, UA: 1.015 (ref 1.010–1.025)
Urobilinogen, UA: 0.2 E.U./dL

## 2018-05-25 NOTE — Progress Notes (Signed)
Patient ID: Paige Dunn, female    DOB: 1981/03/25, 38 y.o.   MRN: [id number]  PCP: Scot Jun, FNP  Chief Complaint  Patient presents with  . Annual Exam    Subjective:  HPI Paige Dunn is a 38 y.o. female, nonsmoker, who presents for complete physical exam. She is an active of routine physical activity.  She has a history of endometriosis, status post ablation. She has had a total of 3 miscarriages and 2 live births.  With last pregnancy she did experience gestational diabetes however required no glycemic management after delivery.  She is followed by neurology for evaluation of a seizure disorder, for which she is currently not taking medication as she has not had a seizure in several years. She has a family history significant for diabetes: Mother is insulin-dependent, maternal grandmother is insulin dependent.  Family history not significant for cardiovascular disease.  She engages in a nonrestrictive diet.  Her current Body mass index is 27.17 kg/m.   She wishes to get a Pap today.  She did have an abnormal breast mass several years ago and had a diagnostic ultrasound performed in 2015.  After review of the ultrasound she is to have a routine mammogram at age 52 and wishes to have the order placed for that screening today.  She has no other issues or concerns.  Health Promotion: Health Screening Current/Overdue:   Immunizations PAP Mammogram, will order Colonscopy or Cologuard, no family history of colon cancer Last Dental Exam: last year, appointment is scheduled for this year. Last Eye Exam: Eye exam scheduled Chronic conditions include: Patient Active Problem List   Diagnosis Date Noted  . Sciatica of left side 05/01/2018  . 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 Authorizing Provider  cyclobenzaprine (FLEXERIL) 5 MG tablet Take 1  tablet (5 mg total) by mouth 3 (three) times daily as needed for muscle spasms. 04/30/18   Scot Jun, FNP  ibuprofen (ADVIL,MOTRIN) 200 MG tablet Take 400 mg by mouth every 6 (six) hours as needed for pain.    [provider]  predniSONE (DELTASONE) 20 MG tablet Take 3 PO QAM x3days, 2 PO QAM x3days, 1 PO QAM x3days 04/30/18   Scot Jun, FNP      Family History  Problem Relation Age of Onset  . Heart attack Father   . Hypertension Maternal Grandmother      Allergies  Allergen Reactions  . Aspirin Nausea And Vomiting and Other (See Comments)    Tremors, dizziness, upset stomach  . Lamictal [Lamotrigine]     Neuromuscular discomfort    Social History   Socioeconomic History  . Marital status: Married    Spouse name: Not on file  . Number of children: Not on file  . Years of education: Not on file  . Highest education level: Not on file  Occupational History  . Not on file  Social Needs  . Financial resource strain: Not on file  . Food insecurity:    Worry: Not on file    Inability: Not on file  . Transportation needs:    Medical: Not on file    Non-medical: Not on file  Tobacco Use  . Smoking status: Never Smoker  . Smokeless tobacco: Never Used  Substance and Sexual Activity  . Alcohol use: No  . Drug use: No  . Sexual  activity: Yes    Partners: Male  Lifestyle  . Physical activity:    Days per week: Not on file    Minutes per session: Not on file  . Stress: Not on file  Relationships  . Social connections:    Talks on phone: Not on file    Gets together: Not on file    Attends religious service: Not on file    Active member of club or organization: Not on file    Attends meetings of clubs or organizations: Not on file    Relationship status: Not on file  . Intimate partner violence:    Fear of current or ex partner: Not on file    Emotionally abused: Not on file    Physically abused: Not on file    Forced sexual activity: Not  on file  Other Topics Concern  . Not on file  Social History Narrative   Patient is married and lives with her husband.    Patient has 2 children.      Review of Systems Pertinent negatives listed in HPI Past Medical, Surgical Family and Social History reviewed and updated.  Objective:   Today's Vitals   05/25/18 1030  BP: 127/82  Pulse: 71  Resp: 16  SpO2: 97%  Weight: 184 lb (83.5 kg)  Height: 5\' 9"  (1.753 m)    Wt Readings from Last 3 Encounters:  05/25/18 184 lb (83.5 kg)  04/30/18 184 lb 3.2 oz (83.6 kg)  08/25/16 192 lb (87.1 kg)    Physical Exam Constitutional: Patient appears well-developed and well-nourished. No distress. HENT: Normocephalic, atraumatic, External right and left ear normal. Oropharynx is clear and moist.  Eyes: Conjunctivae and EOM are normal. PERRLA, no scleral icterus. Neck: Normal ROM. Neck supple. No JVD. No tracheal deviation. No thyromegaly. CVS: RRR, S1/S2 +, no murmurs, no gallops, no carotid bruit.  Pulmonary: Effort and breath sounds normal, no stridor, rhonchi, wheezes, rales.  Abdominal: Soft. BS +, no distension, tenderness, rebound or guarding.  Musculoskeletal: Normal range of motion. No edema and no tenderness.  Genitourinary: Breasts are symmetric without cutaneous changes, nipple inversion or discharge. No masses or tenderness, and no axillary lymphadenopathy. Normal female external genitalia without lesion. No inguinal lymphadenopathy. Vaginal mucosa is pink and moist without lesions. Cervix is closed without discharge, not friable. Pap smear obtained. No cervical motion tenderness, adnexal fullness or tenderness. Neuro: Alert. Normal reflexes, muscle tone coordination. No cranial nerve deficit. Skin: Skin is warm and dry. No rash noted. Not diaphoretic. No erythema. No pallor. Psychiatric: Normal mood and affect. Behavior, judgment, thought content normal. Assessment & Plan:  1. Annual physical exam Age-appropriate  anticipatory guidance provided  2. Thyroid disorder screen - TSH  3. Lipid screening - Lipid Panel  4. Diabetes mellitus screening - Comprehensive metabolic panel - POCT URINALYSIS DIP (CLINITEK) - Hemoglobin A1c  5. Screening for HIV without presence of risk factors - HIV antibody (with reflex)  6. Screening for deficiency anemia - CBC with Differential  7. Cervical cancer screening - Cytology - PAP([place])  8.  Breast cancer screening -Order placed for mammogram  9.  Screen for blood or protein in urine Point-of-care urine dipstick, normal      Orders Placed This Encounter  Procedures  . Urine Culture  . MM Digital Screening  . CBC with Differential  . Comprehensive metabolic panel  . TSH  . Hemoglobin A1c  . Lipid Panel  . HIV antibody (with reflex)  . POCT URINALYSIS  DIP (CLINITEK)    Molli Barrows, FNP Primary Care at [facility] [address], [place] [phone]: [phone]

## 2018-05-25 NOTE — Patient Instructions (Addendum)
[facility] at [phone] to schedule your physical therapy appointment To schedule your breast exam, please call Breast Clinic at  Phone: [phone], your last ultrasound of the breast recommended a routine screening at age 37.  Order for your mammogram has been placed.  Keeping You Healthy  Get These Tests 1. Blood Pressure- Have your blood pressure checked once a year by your health care provider.  Normal blood pressure is 120/80. 2. Weight- Have your body mass index (BMI) calculated to screen for obesity.  BMI is measure of body fat based on height and weight.  You can also calculate your own BMI at GravelBags.it. 3. Cholesterol- Have your cholesterol checked every 5 years starting at age 15 then yearly starting at age 20. 32. Chlamydia, HIV, and other sexually transmitted diseases- Get screened every year until age 67, then within three months of each new sexual provider. 5. Pap Test - Every 1-5 years; discuss with your health care provider. 6. Mammogram- Every 1-2 years starting at age 15--50  Take these medicines  Calcium with Vitamin D-Your body needs 1200 mg of Calcium each day and [phone] IU of Vitamin D daily.  Your body can only absorb 500 mg of Calcium at a time so Calcium must be taken in 2 or 3 divided doses throughout the day.  Multivitamin with folic acid- Once daily if it is possible for you to become pregnant.  Get these Immunizations  Gardasil-Series of three doses; prevents HPV related illness such as genital warts and cervical cancer.  Menactra-Single dose; prevents meningitis.  Tetanus shot- Every 10 years.  Flu shot-Every year.  Take these steps 1. Do not smoke-Your healthcare provider can help you quit.  For tips on how to quit go to www.smokefree.gov or call 1-800 QUITNOW. 2. Be physically active- Exercise 5 days a week for at least 30 minutes.  If you are not already physically active, start slow and gradually  work up to 30 minutes of moderate physical activity.  Examples of moderate activity include walking briskly, dancing, swimming, bicycling, etc. 3. Breast Cancer- A self breast exam every month is important for early detection of breast cancer.  For more information and instruction on self breast exams, ask your healthcare provider or https://www.patel.info/. 4. Eat a healthy diet- Eat a variety of healthy foods such as fruits, vegetables, whole grains, low fat milk, low fat cheeses, yogurt, lean meats, poultry and fish, beans, nuts, tofu, etc.  For more information go to www. Thenutritionsource.org 5. Drink alcohol in moderation- Limit alcohol intake to one drink or less per day. Never drink and drive. 6. Depression- Your emotional health is as important as your physical health.  If you're feeling down or losing interest in things you normally enjoy please talk to your healthcare provider about being screened for depression. 7. Dental visit- Brush and floss your teeth twice daily; visit your dentist twice a year. 8. Eye doctor- Get an eye exam at least every 2 years. 9. Helmet use- Always wear a helmet when riding a bicycle, motorcycle, rollerblading or skateboarding. 18. Safe sex- If you may be exposed to sexually transmitted infections, use a condom. 11. Seat belts- Seat belts can save your live; always wear one. 12. Smoke/Carbon Monoxide detectors- These detectors need to be installed on the appropriate level of your home. Replace batteries at least once a year. 13. Skin cancer- When out in the sun please cover up and use sunscreen 15 SPF or higher. 14. Violence- If  anyone is threatening or hurting you, please tell your healthcare provider.

## 2018-05-26 LAB — CBC WITH DIFFERENTIAL/PLATELET
BASOS ABS: 0 10*3/uL (ref 0.0–0.2)
BASOS: 1 %
EOS (ABSOLUTE): 0.1 10*3/uL (ref 0.0–0.4)
Eos: 1 %
HEMOGLOBIN: 12.4 g/dL (ref 11.1–15.9)
Hematocrit: 37.3 % (ref 34.0–46.6)
IMMATURE GRANS (ABS): 0 10*3/uL (ref 0.0–0.1)
Immature Granulocytes: 0 %
LYMPHS ABS: 1.6 10*3/uL (ref 0.7–3.1)
Lymphs: 37 %
MCH: 28 pg (ref 26.6–33.0)
MCHC: 33.2 g/dL (ref 31.5–35.7)
MCV: 84 fL (ref 79–97)
Monocytes Absolute: 0.4 10*3/uL (ref 0.1–0.9)
Monocytes: 10 %
NEUTROS ABS: 2.2 10*3/uL (ref 1.4–7.0)
Neutrophils: 51 %
PLATELETS: 396 10*3/uL (ref 150–450)
RBC: 4.43 x10E6/uL (ref 3.77–5.28)
RDW: 12.7 % (ref 11.7–15.4)
WBC: 4.3 10*3/uL (ref 3.4–10.8)

## 2018-05-26 LAB — COMPREHENSIVE METABOLIC PANEL
A/G RATIO: 1.6 (ref 1.2–2.2)
ALBUMIN: 4.5 g/dL (ref 3.8–4.8)
ALK PHOS: 76 IU/L (ref 39–117)
ALT: 11 IU/L (ref 0–32)
AST: 11 IU/L (ref 0–40)
BILIRUBIN TOTAL: 0.5 mg/dL (ref 0.0–1.2)
BUN / CREAT RATIO: 13 (ref 9–23)
BUN: 10 mg/dL (ref 6–20)
CHLORIDE: 99 mmol/L (ref 96–106)
CO2: 24 mmol/L (ref 20–29)
Calcium: 9.5 mg/dL (ref 8.7–10.2)
Creatinine, Ser: 0.75 mg/dL (ref 0.57–1.00)
GFR calc non Af Amer: 101 mL/min/{1.73_m2} (ref >59)
GFR, EST AFRICAN AMERICAN: 116 mL/min/{1.73_m2} (ref >59)
GLUCOSE: 90 mg/dL (ref 65–99)
Globulin, Total: 2.8 g/dL (ref 1.5–4.5)
POTASSIUM: 4.2 mmol/L (ref 3.5–5.2)
Sodium: 138 mmol/L (ref 134–144)
TOTAL PROTEIN: 7.3 g/dL (ref 6.0–8.5)

## 2018-05-26 LAB — LIPID PANEL
Chol/HDL Ratio: 4.9 ratio — ABNORMAL HIGH (ref 0.0–4.4)
Cholesterol, Total: 195 mg/dL (ref 100–199)
HDL: 40 mg/dL (ref >39)
LDL CALC: 134 mg/dL — AB (ref 0–99)
Triglycerides: 103 mg/dL (ref 0–149)
VLDL CHOLESTEROL CAL: 21 mg/dL (ref 5–40)

## 2018-05-26 LAB — HIV ANTIBODY (ROUTINE TESTING W REFLEX): HIV SCREEN 4TH GENERATION: NONREACTIVE

## 2018-05-26 LAB — TSH: TSH: 1.11 u[IU]/mL (ref 0.450–4.500)

## 2018-05-26 LAB — HEMOGLOBIN A1C
Est. average glucose Bld gHb Est-mCnc: 120 mg/dL
Hgb A1c MFr Bld: 5.8 % — ABNORMAL HIGH (ref 4.8–5.6)

## 2018-05-27 LAB — CYTOLOGY - PAP
Bacterial vaginitis: NEGATIVE
CANDIDA VAGINITIS: NEGATIVE
CHLAMYDIA, DNA PROBE: NEGATIVE
DIAGNOSIS: NEGATIVE
HPV: NOT DETECTED
NEISSERIA GONORRHEA: NEGATIVE
TRICH (WINDOWPATH): NEGATIVE

## 2018-05-27 LAB — URINE CULTURE

## 2018-05-28 ENCOUNTER — Encounter: Payer: Self-pay | Admitting: Family Medicine

## 2018-05-28 ENCOUNTER — Other Ambulatory Visit: Payer: Self-pay | Admitting: Family Medicine

## 2018-05-28 MED ORDER — NITROFURANTOIN MONOHYD MACRO 100 MG PO CAPS: 100.0000 mg | ORAL_CAPSULE | Freq: Two times a day (BID) | ORAL | 0 refills | Status: DC

## 2018-05-28 NOTE — Progress Notes (Signed)
Macrobid ordered.

## 2018-06-04 ENCOUNTER — Ambulatory Visit (HOSPITAL_COMMUNITY)
Admission: EM | Admit: 2018-06-04 | Discharge: 2018-06-04 | Disposition: A | Discharge status: Home / Self Care | Payer: BC Managed Care – PPO | Source: Home / Self Care

## 2018-06-04 ENCOUNTER — Emergency Department (HOSPITAL_COMMUNITY): Payer: BC Managed Care – PPO

## 2018-06-04 ENCOUNTER — Encounter (HOSPITAL_COMMUNITY): Payer: Self-pay

## 2018-06-04 ENCOUNTER — Other Ambulatory Visit: Payer: Self-pay

## 2018-06-04 ENCOUNTER — Emergency Department (HOSPITAL_COMMUNITY)
Admission: EM | Admit: 2018-06-04 | Discharge: 2018-06-04 | Disposition: A | Discharge status: Home / Self Care | Payer: BC Managed Care – PPO | Attending: Emergency Medicine | Admitting: Emergency Medicine

## 2018-06-04 DIAGNOSIS — Z79899 Other long term (current) drug therapy: Secondary | ICD-10-CM | POA: Diagnosis not present

## 2018-06-04 DIAGNOSIS — G40909 Epilepsy, unspecified, not intractable, without status epilepticus: Secondary | ICD-10-CM

## 2018-06-04 DIAGNOSIS — R569 Unspecified convulsions: Secondary | ICD-10-CM | POA: Insufficient documentation

## 2018-06-04 LAB — CBC
HCT: 38.7 % (ref 36.0–46.0)
HEMOGLOBIN: 12.2 g/dL (ref 12.0–15.0)
MCH: 27.1 pg (ref 26.0–34.0)
MCHC: 31.5 g/dL (ref 30.0–36.0)
MCV: 85.8 fL (ref 80.0–100.0)
Platelets: 374 10*3/uL (ref 150–400)
RBC: 4.51 MIL/uL (ref 3.87–5.11)
RDW: 12.4 % (ref 11.5–15.5)
WBC: 6.6 10*3/uL (ref 4.0–10.5)
nRBC: 0 % (ref 0.0–0.2)

## 2018-06-04 LAB — I-STAT TROPONIN, ED: Troponin i, poc: 0 ng/mL (ref 0.00–0.08)

## 2018-06-04 LAB — BASIC METABOLIC PANEL
Anion gap: 8 (ref 5–15)
BUN: 10 mg/dL (ref 6–20)
CO2: 23 mmol/L (ref 22–32)
Calcium: 9.3 mg/dL (ref 8.9–10.3)
Chloride: 104 mmol/L (ref 98–111)
Creatinine, Ser: 0.57 mg/dL (ref 0.44–1.00)
GFR calc Af Amer: >60 mL/min (ref >60)
GLUCOSE: 100 mg/dL — AB (ref 70–99)
POTASSIUM: 4.2 mmol/L (ref 3.5–5.1)
SODIUM: 135 mmol/L (ref 135–145)

## 2018-06-04 LAB — I-STAT BETA HCG BLOOD, ED (MC, WL, AP ONLY)

## 2018-06-04 MED ORDER — SODIUM CHLORIDE 0.9% FLUSH: 3.0000 mL | Freq: Once | INTRAVENOUS | Status: DC

## 2018-06-04 MED ORDER — CLONAZEPAM 0.5 MG PO TABS
0.5000 mg | ORAL_TABLET | Freq: Once | ORAL | Status: AC
  Administered 2018-06-04: 0.5 mg via ORAL
  Filled 2018-06-04: qty 1

## 2018-06-04 NOTE — ED Notes (Signed)
Patient describes sudden onset of difficulty performing routine tasks today, planning, scheduling, making lists have been cumbersome.  Spouse with patient and has random text messages from episodes today.  Patient describes feeling of intermitten clarity and disclosed she has a history of seizures 4-5 years ago and had similar aura prior to seizures.  Patient is currently alert and answering appropriately.  Patient and spouse agreeable to go to ed.    Dr Meda Coffee has discussed this patient with this nurse

## 2018-06-04 NOTE — ED Provider Notes (Signed)
[facility] EMERGENCY DEPARTMENT Provider Note   CSN: [id number] Arrival date & time: 06/04/18  1504     History   Chief Complaint Chief Complaint  Patient presents with  . Anxiety    HPI Paige Dunn is a 38 y.o. female.  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  She had an episode at the end of December that was similar to this as well as an episode in early January that was similar.  She does have a history of prior epilepsy maintained on carbamazepine.  This last summer her and her neurologist together decided to try and wean off the carbamazepine.  She had been seizure-free to that point for 4 to 5 years.  She weaned the summer without any difficulty.  She does report that the episodes that occurred today as well as the 2 other events were similar to her prodromal symptoms prior to seizures.  She has had no significant headache.  She denies weakness of her arms or legs.  She currently is well and has no acute symptoms.  She states that when she had this episode today it lasted for 1 to 2 minutes.  It was followed by a sensation of shortness of breath and anxiousness.  It then resolved spontaneously on its own.   Past Medical History:  Diagnosis Date  . Cesarean delivery delivered   . Epilepsy ([place])   . Fibrocystic breast disease   . Headache(784.0)    menstrual  . Seizures ([place])     Patient Active Problem List   Diagnosis Date Noted  . Sciatica of left side 05/01/2018  . 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 CESAREAN SECTION    . DILATION AND CURETTAGE OF UTERUS    . DILATION AND EVACUATION  08/16/2011   Procedure: DILATATION AND EVACUATION;  Surgeon: Alwyn Pea, MD;  Location: [facility];  Service: Gynecology;  Laterality: N/A;  . HYSTEROSCOPY  2004  .  LAPAROSCOPY  2004     OB History    Gravida  5   Para  3   Term      Preterm      AB      Living  1     SAB      TAB      Ectopic      Multiple      Live Births  1            Home Medications    Prior to Admission medications   Medication Sig Start Date End Date Taking? Authorizing Provider  cyclobenzaprine (FLEXERIL) 5 MG tablet Take 1 tablet (5 mg total) by mouth 3 (three) times daily as needed for muscle spasms. 04/30/18   Scot Jun, FNP  ibuprofen (ADVIL,MOTRIN) 200 MG tablet Take 400 mg by mouth every 6 (six) hours as needed for pain.    [provider]  nitrofurantoin, macrocrystal-monohydrate, (MACROBID) 100 MG capsule Take 1 capsule (100 mg total) by mouth 2 (two) times daily. 05/28/18   Scot Jun, FNP  predniSONE (DELTASONE) 20 MG tablet Take 3 PO QAM x3days, 2 PO QAM x3days, 1 PO QAM x3days 04/30/18   Scot Jun, FNP    Family History Family History  Problem Relation Age of Onset  . Heart attack Father   .  Hypertension Maternal Grandmother     Social History Social History   Tobacco Use  . Smoking status: Never Smoker  . Smokeless tobacco: Never Used  Substance Use Topics  . Alcohol use: No  . Drug use: No     Allergies   Aspirin and Lamictal [lamotrigine]   Review of Systems Review of Systems  All other systems reviewed and are negative.    Physical Exam Updated Vital Signs BP (!) 116/52   Pulse 74   Temp 98.3 F (36.8 C)   Resp 17   Ht 5\' 9"  (1.753 m)   Wt 84 kg   SpO2 96%   BMI 27.35 kg/m   Physical Exam Vitals signs and nursing note reviewed.  Constitutional:      General: She is not in acute distress.    Appearance: She is well-developed.  HENT:     Head: Normocephalic and atraumatic.  Eyes:     Pupils: Pupils are equal, round, and reactive to light.  Neck:     Musculoskeletal: Normal range of motion.  Cardiovascular:     Rate and Rhythm: Normal rate and regular rhythm.      Heart sounds: Normal heart sounds.  Pulmonary:     Effort: Pulmonary effort is normal.     Breath sounds: Normal breath sounds.  Abdominal:     General: There is no distension.     Palpations: Abdomen is soft.     Tenderness: There is no abdominal tenderness.  Musculoskeletal: Normal range of motion.  Skin:    General: Skin is warm and dry.  Neurological:     Mental Status: She is alert and oriented to person, place, and time.     Comments: 5/5 strength in major muscle groups of  bilateral upper and lower extremities. Speech normal. No facial asymetry.   Psychiatric:        Judgment: Judgment normal.      ED Treatments / Results  Labs (all labs ordered are listed, but only abnormal results are displayed) Labs Reviewed  BASIC METABOLIC PANEL - Abnormal; Notable for the following components:      Result Value   Glucose, Bld 100 (*)    All other components within normal limits  CBC  I-STAT TROPONIN, ED  I-STAT BETA HCG BLOOD, ED ([facility], [facility], [facility] ONLY)    EKG EKG Interpretation  Date/Time:  Thursday June 04 2018 15:19:12 EST Ventricular Rate:  78 PR Interval:  156 QRS Duration: 90 QT Interval:  376 QTC Calculation: 428 R Axis:   74 Text Interpretation:  Normal sinus rhythm Cannot rule out Anterior infarct , age undetermined Abnormal ECG No significant change was found Confirmed by Jola Schmidt [phone]) on 06/04/2018 5:32:50 PM   Radiology Dg Chest 2 View  Result Date: 06/04/2018 CLINICAL DATA:  Shortness of breath. EXAM: CHEST - 2 VIEW COMPARISON:  August 25, 2016 FINDINGS: The heart size and mediastinal contours are within normal limits. Both lungs are clear. The visualized skeletal structures are unremarkable. IMPRESSION: No active cardiopulmonary disease. Electronically Signed   By: Dorise Bullion III M.D   On: 06/04/2018 16:50    Procedures Procedures (including critical care time)  Medications Ordered in ED Medications  clonazePAM (KLONOPIN) tablet 0.5 mg (0.5  mg Oral Given 06/04/18 1653)     Initial Impression / Assessment and Plan / ED Course  I have reviewed the triage vital signs and the nursing notes.  Pertinent labs & imaging results that were available during my care  of the patient were reviewed by me and considered in my medical decision making (see chart for details).     Doubt TIA.  Normal neurologic exam.  I suspect these are aura type symptoms prior to a potential seizure.  She has not had seizure.  She will discuss with her neurologist whether or not she should restart her carbamazepine which she tolerated without difficulty before in the past.  At this time I gave her standard seizure precautions.  I do not think she needs advanced imaging of her head at this time.  Normal neurologic exam.  Overall well-appearing.  Patient and family understand to return to the emergency department for new or worsening symptoms.  All questions answered.  Final Clinical Impressions(s) / ED Diagnoses   Final diagnoses:  Seizure disorder [facility])    ED Discharge Orders    None       Jola Schmidt, MD 06/04/18 1754

## 2018-06-04 NOTE — Discharge Instructions (Addendum)
Call your neurologist for follow up

## 2018-06-04 NOTE — ED Notes (Signed)
Pt alert and oriented in NAD. Pt verbalized understanding of discharge instructions. 

## 2018-06-04 NOTE — ED Triage Notes (Signed)
Pt arrives ambulatory POV to bring in father for eval and started having a "feeling where she was unable to get her words out" Pt reports this may be an anxiety attach "but it may not be". Pt states hx of epilepsy and that she was taken off her meds in June by PCP. Pt w/ clear and fluent speech, no neuro deficits on exam.

## 2018-06-04 NOTE — ED Notes (Signed)
EDP remains at bedside 

## 2018-06-24 ENCOUNTER — Ambulatory Visit: Payer: BC Managed Care – PPO

## 2018-06-24 DIAGNOSIS — R8290 Unspecified abnormal findings in urine: Secondary | ICD-10-CM

## 2018-06-25 LAB — URINE CULTURE: Organism ID, Bacteria: NO GROWTH

## 2018-10-09 ENCOUNTER — Other Ambulatory Visit: Payer: Self-pay

## 2018-10-09 ENCOUNTER — Emergency Department (HOSPITAL_COMMUNITY)
Admission: EM | Admit: 2018-10-09 | Discharge: 2018-10-09 | Disposition: A | Discharge status: Home / Self Care | Payer: BC Managed Care – PPO | Attending: Emergency Medicine | Admitting: Emergency Medicine

## 2018-10-09 ENCOUNTER — Emergency Department (HOSPITAL_COMMUNITY): Payer: BC Managed Care – PPO

## 2018-10-09 ENCOUNTER — Telehealth: Payer: Self-pay | Admitting: Family Medicine

## 2018-10-09 DIAGNOSIS — M5412 Radiculopathy, cervical region: Secondary | ICD-10-CM | POA: Insufficient documentation

## 2018-10-09 DIAGNOSIS — R079 Chest pain, unspecified: Secondary | ICD-10-CM | POA: Diagnosis present

## 2018-10-09 DIAGNOSIS — Z79899 Other long term (current) drug therapy: Secondary | ICD-10-CM | POA: Insufficient documentation

## 2018-10-09 LAB — CBC
HCT: 38.7 % (ref 36.0–46.0)
Hemoglobin: 12.6 g/dL (ref 12.0–15.0)
MCH: 27.5 pg (ref 26.0–34.0)
MCHC: 32.6 g/dL (ref 30.0–36.0)
MCV: 84.3 fL (ref 80.0–100.0)
Platelets: 346 10*3/uL (ref 150–400)
RBC: 4.59 MIL/uL (ref 3.87–5.11)
RDW: 12.5 % (ref 11.5–15.5)
WBC: 4.3 10*3/uL (ref 4.0–10.5)
nRBC: 0 % (ref 0.0–0.2)

## 2018-10-09 LAB — BASIC METABOLIC PANEL
Anion gap: 8 (ref 5–15)
BUN: 12 mg/dL (ref 6–20)
CO2: 24 mmol/L (ref 22–32)
Calcium: 9 mg/dL (ref 8.9–10.3)
Chloride: 104 mmol/L (ref 98–111)
Creatinine, Ser: 0.75 mg/dL (ref 0.44–1.00)
GFR calc Af Amer: >60 mL/min (ref >60)
GFR calc non Af Amer: >60 mL/min (ref >60)
Glucose, Bld: 138 mg/dL — ABNORMAL HIGH (ref 70–99)
Potassium: 3.9 mmol/L (ref 3.5–5.1)
Sodium: 136 mmol/L (ref 135–145)

## 2018-10-09 LAB — TROPONIN I: Troponin I: <0.03 ng/mL (ref <0.03)

## 2018-10-09 MED ORDER — HYDROCODONE-ACETAMINOPHEN 5-325 MG PO TABS
1.0000 | ORAL_TABLET | Freq: Once | ORAL | Status: AC
  Administered 2018-10-09: 1 via ORAL
  Filled 2018-10-09: qty 1

## 2018-10-09 MED ORDER — GADOBUTROL 1 MMOL/ML IV SOLN
8.0000 mL | Freq: Once | INTRAVENOUS | Status: AC | PRN
  Administered 2018-10-09: 8 mL via INTRAVENOUS

## 2018-10-09 MED ORDER — PREDNISONE 20 MG PO TABS
60.0000 mg | ORAL_TABLET | Freq: Once | ORAL | Status: AC
  Administered 2018-10-09: 60 mg via ORAL
  Filled 2018-10-09: qty 3

## 2018-10-09 MED ORDER — ONDANSETRON HCL 4 MG/2ML IJ SOLN
4.0000 mg | Freq: Once | INTRAMUSCULAR | Status: AC
  Administered 2018-10-09: 4 mg via INTRAVENOUS
  Filled 2018-10-09: qty 2

## 2018-10-09 MED ORDER — GABAPENTIN 300 MG PO CAPS: 300.0000 mg | ORAL_CAPSULE | Freq: Two times a day (BID) | ORAL | 0 refills | Status: DC

## 2018-10-09 MED ORDER — PREDNISONE 10 MG PO TABS: 40.0000 mg | ORAL_TABLET | Freq: Every day | ORAL | 0 refills | Status: DC

## 2018-10-09 NOTE — Telephone Encounter (Signed)
Patient called and stated she is having back pain down the left arm and chest pain on the left side

## 2018-10-09 NOTE — Discharge Instructions (Signed)
Thank you for allowing me to care for you today. Please return to the emergency department if you have new or worsening symptoms. Take your medications as instructed.  ° °

## 2018-10-09 NOTE — ED Notes (Signed)
Pt to MRI

## 2018-10-09 NOTE — ED Triage Notes (Signed)
Pt in with L shoulder/chest pain that began 2 days ago - pt states the pain began in her L posterior shoulder and has migrated around to the anterior L chest

## 2018-10-09 NOTE — Telephone Encounter (Signed)
Patients call taken.  Patient identified by name and date of birth.  Patient called complaining of left sided chest pain for 2 days.  Patient states that the pain feels like pressure.  Pain radiates to the back and down her left arm.  Patient rates pain as 5/10 in her chest and 8/10 in her back and left arm,  Patient advised to go to the Emergency Room to be evaluated.  Patient acknowledged understanding of advice.

## 2018-10-09 NOTE — ED Provider Notes (Signed)
[facility] EMERGENCY DEPARTMENT Provider Note   CSN: [id number] Arrival date & time: 10/09/18  1132    History   Chief Complaint Chief Complaint  Patient presents with   Chest Pain   Arm Pain    HPI Paige Dunn is a 38 y.o. female.     38 year old female with past medical of epilepsy presents emergency department for chest pain and left arm pain.  Patient reports that last night this began with pain in the upper thoracic and lower neck area on the left side.  Reports that it began to radiate down into her left arm.  Reports that it felt like her left arm was asleep and she is having burning and numbness and tingling into her third through fifth digits.  Patient reports that she has aching pain from her neck into her arm.  Reports that it started to extend into her chest and felt like there is also pressure like somebody was sitting on her chest.  Denies any shortness of breath, nausea, vomiting.  Denies any family history of cardiac problems.  She does not smoke.     Past Medical History:  Diagnosis Date   Cesarean delivery delivered    Epilepsy [facility])    Fibrocystic breast disease    Headache(784.0)    menstrual   Seizures ([place])     Patient Active Problem List   Diagnosis Date Noted   Sciatica of left side 05/01/2018   Seizure disorder, primary generalized ([place]) 12/09/2012    Past Surgical History:  Procedure Laterality Date   CESAREAN SECTION     DILATION AND CURETTAGE OF UTERUS     DILATION AND EVACUATION  08/16/2011   Procedure: DILATATION AND EVACUATION;  Surgeon: Alwyn Pea, MD;  Location: [facility];  Service: Gynecology;  Laterality: N/A;   HYSTEROSCOPY  2004   LAPAROSCOPY  2004     OB History    Gravida  5   Para  3   Term      Preterm      AB      Living  1     SAB      TAB      Ectopic      Multiple      Live Births  1            Home Medications    Prior to Admission medications     Medication Sig Start Date End Date Taking? Authorizing Provider  cyclobenzaprine (FLEXERIL) 5 MG tablet Take 1 tablet (5 mg total) by mouth 3 (three) times daily as needed for muscle spasms. 04/30/18   Scot Jun, FNP  ibuprofen (ADVIL,MOTRIN) 200 MG tablet Take 400 mg by mouth every 6 (six) hours as needed for pain.    [provider]  nitrofurantoin, macrocrystal-monohydrate, (MACROBID) 100 MG capsule Take 1 capsule (100 mg total) by mouth 2 (two) times daily. 05/28/18   Scot Jun, FNP  predniSONE (DELTASONE) 20 MG tablet Take 3 PO QAM x3days, 2 PO QAM x3days, 1 PO QAM x3days 04/30/18   Scot Jun, FNP    Family History Family History  Problem Relation Age of Onset   Heart attack Father    Hypertension Maternal Grandmother     Social History Social History   Tobacco Use   Smoking status: Never Smoker   Smokeless tobacco: Never Used  Substance Use Topics   Alcohol use: No   Drug use: No  Allergies   Aspirin and Lamictal [lamotrigine]   Review of Systems Review of Systems  Constitutional: Negative.   HENT: Negative for congestion, sinus pressure and sinus pain.   Respiratory: Negative for cough and shortness of breath.   Cardiovascular: Positive for chest pain. Negative for palpitations and leg swelling.  Gastrointestinal: Negative for abdominal pain, diarrhea, nausea and vomiting.  Genitourinary: Negative for dysuria.  Musculoskeletal: Positive for arthralgias and neck pain. Negative for back pain and neck stiffness.  Skin: Negative for rash.  Neurological: Positive for numbness. Negative for dizziness, tremors, syncope, facial asymmetry, speech difficulty, weakness, light-headedness and headaches.     Physical Exam Updated Vital Signs BP 113/75    Pulse 65    Temp 98.7 F (37.1 C) (Oral)    Resp 19    SpO2 100%   Physical Exam   ED Treatments / Results  Labs (all labs ordered are listed, but only abnormal results are  displayed) Labs Reviewed  BASIC METABOLIC PANEL - Abnormal; Notable for the following components:      Result Value   Glucose, Bld 138 (*)    All other components within normal limits  CBC  TROPONIN I    EKG None  Radiology Mr Cervical Spine W Or Wo Contrast  Result Date: 10/09/2018 CLINICAL DATA:  Neck and left shoulder pain for 2 days. EXAM: MRI CERVICAL SPINE WITHOUT AND WITH CONTRAST TECHNIQUE: Multiplanar and multiecho pulse sequences of the cervical spine, to include the craniocervical junction and cervicothoracic junction, were obtained without and with intravenous contrast. CONTRAST:  8 cc Gadavist COMPARISON:  None. FINDINGS: Alignment: Mild reversal of the normal cervical lordosis could be due to positioning, muscle spasm or pain. The overall alignment is maintained. Vertebrae: No bone lesions or fractures. Cord: Normal cord signal intensity. No cord lesions or syrinx. No areas of abnormal cord enhancement. No Chiari malformation. Posterior Fossa, vertebral arteries, paraspinal tissues: No significant findings. Largely empty sella is noted. Disc levels: C2-3: No significant findings. C3-4: No significant findings. C4-5: Mild annular bulge with slight flattening of the ventral thecal sac and mild narrowing the ventral CSF space but no spinal or foraminal stenosis. C5-6: Broad-based disc protrusion, osteophytic ridging and uncinate spurring with mass effect on the thecal sac and narrowing the ventral CSF space. There is also moderate right foraminal stenosis likely effecting the right C6 nerve root. C6-7: Broad-based disc protrusion and left-sided uncinate spurring with flattening of the ventral thecal sac and narrowing the ventral CSF space and mild left foraminal stenosis possibly irritating the left C7 nerve root. C7-T1: No significant findings. IMPRESSION: 1. Broad-based disc protrusions at C5-6 and C6-7. 2. Right foraminal stenosis at C5-6 and left foraminal stenosis at C6-7.  Electronically Signed   By: Marijo Sanes M.D.   On: 10/09/2018 13:46   Dg Chest Port 1 View  Result Date: 10/09/2018 CLINICAL DATA:  Left shoulder and chest pain. EXAM: PORTABLE CHEST 1 VIEW COMPARISON:  PA lateral chest 06/04/2018. FINDINGS: Mediastinum hilar structures normal. Low lung volumes. No focal alveolar infiltrate. No pleural effusion or pneumothorax. Heart size stable. No acute bony abnormality. Mild thoracic spine scoliosis. IMPRESSION: 1.  Low lung volumes.  No acute pulmonary infiltrate. 2.  No acute bony abnormality.  Mild thoracic spine scoliosis. Electronically Signed   By: Marcello Moores  Register   On: 10/09/2018 12:15    Procedures Procedures (including critical care time)  Medications Ordered in ED Medications  HYDROcodone-acetaminophen (NORCO/VICODIN) 5-325 MG per tablet 1 tablet (1 tablet Oral  Given 10/09/18 1156)  gadobutrol (GADAVIST) 1 MMOL/ML injection 8 mL (8 mLs Intravenous Contrast Given 10/09/18 1305)  predniSONE (DELTASONE) tablet 60 mg (60 mg Oral Given 10/09/18 1428)  ondansetron (ZOFRAN) injection 4 mg (4 mg Intravenous Given 10/09/18 1428)     Initial Impression / Assessment and Plan / ED Course  I have reviewed the triage vital signs and the nursing notes.  Pertinent labs & imaging results that were available during my care of the patient were reviewed by me and considered in my medical decision making (see chart for details).  Clinical Course as of Oct 08 1437  Fri Oct 09, 2018  1250 Symptoms are most likely consistent with a cervical radiculopathy.  However, patient is very concerned about her chest pain and pressure which would not be explained by radiculopathy.  I am going to obtain a cardiac work-up and also a cervical spine MRI.  Her heart score is 1.   [KM]  0865 MRI showing foraminal stenosis on the L at C6 and C7 with broad based disc protrusion which is likely the cause of the patients symptoms. Cardiac workup negative. She is improved with hydrocodone.  Will add prednisone and send her home with the same. F/u PMD   [KM]    Clinical Course User Index [KM] Alveria Apley, PA-C         Final Clinical Impressions(s) / ED Diagnoses   Final diagnoses:  Cervical radiculitis    ED Discharge Orders    None       Kristine Royal 10/09/18 [place], MD 10/12/18 [phone]

## 2018-10-15 ENCOUNTER — Telehealth (INDEPENDENT_AMBULATORY_CARE_PROVIDER_SITE_OTHER): Payer: BC Managed Care – PPO | Admitting: Family Medicine

## 2018-10-15 DIAGNOSIS — M25552 Pain in left hip: Secondary | ICD-10-CM | POA: Diagnosis not present

## 2018-10-15 DIAGNOSIS — M5412 Radiculopathy, cervical region: Secondary | ICD-10-CM

## 2018-10-15 DIAGNOSIS — G5602 Carpal tunnel syndrome, left upper limb: Secondary | ICD-10-CM

## 2018-10-15 DIAGNOSIS — M4802 Spinal stenosis, cervical region: Secondary | ICD-10-CM | POA: Diagnosis not present

## 2018-10-15 MED ORDER — NAPROXEN 500 MG PO TABS: 500.0000 mg | ORAL_TABLET | Freq: Two times a day (BID) | ORAL | 1 refills | Status: DC

## 2018-10-15 MED ORDER — ONDANSETRON 4 MG PO TBDP: 4.0000 mg | ORAL_TABLET | Freq: Three times a day (TID) | ORAL | 1 refills | Status: DC | PRN

## 2018-10-15 MED ORDER — TIZANIDINE HCL 4 MG PO CAPS: 4.0000 mg | ORAL_CAPSULE | Freq: Two times a day (BID) | ORAL | 1 refills | Status: DC | PRN

## 2018-10-15 NOTE — Progress Notes (Signed)
Virtual Visit via Video Note  I connected with Paige Dunn on 10/15/18 at 10:30 AM EDT by a video enabled telemedicine application and verified that I am speaking with the correct person using two identifiers.  Location: Patient: Located at home during today's encounter  Provider: Located at primary care office    I discussed the limitations of evaluation and management by telemedicine and the availability of in person appointments. The patient expressed understanding and agreed to proceed.   Paige Dunn has Seizure disorder, primary generalized ([place]) and Sciatica of left side on their problem list.   History of Present Illness: ER follow-up   Paige Dunn presented to [facility] 10/09/18 with acute onset left-sided neck pain with radiation down the left arm. She also endorsed paresthesia involving the 3-5th digit. MRI were completed (see imaging) confirming foraminal stenosis of the left C6-C7 with a broad based protrusion at the C-7. Patient was given Gabapentin and prednisone taper and advised to follow-up with PCP.  Paige Dunn has had minimal relief with prescribed treatment. Current pain level is at 4/10. She is unable to sit and lay in certain positions as ROM exacerbates pain. She was having symptoms of blurred vision with Gabapentin, therefore decreased frequency of dosing to twice daily as medications helps with pain management. She is also applying heat PRN to neck. Continue to endorses numbness in digits 3 and 4 of the left hand. Denies a known history of carpal tunnel, however patient is an Tourist information centre manager. Up until recently, she was spending extended hours on computer. Denies any right sided involvement.   Left Hip Pain Patient was seen for left hip pain back in December. Left hip pain was thought to be related to sciatica and she was referred to physical therapy. Due to COVID 19, patient never started PT. Pain has never resolved. Requesting referral to orthopedics.      Observations/Objective:   Assessment and Plan: 1. Carpal tunnel syndrome of left wrist -Symptoms consistent with carpal tunnel symptoms given paresthesia of 3rd and 4th digits. -Placing a referral to neurology for nerve conduction testing and to orthopedics for evaluation and treatment of suspected CT syndrome.   - Ambulatory referral to Neurology - Ambulatory referral to Orthopedic Surgery  2. Cervical radiculitis -Abnormal MRI and non-responsive to prednisone - Ambulatory referral to Neurology, nerve conduction testing  -Advise to discontinue Gabapentin giving blurring of vision.  -Start Tizanidine 4 mg BID PRN for neck spasms and pain. - Ambulatory referral to Orthopedic Surgery  3. Spinal stenosis in cervical region - Ambulatory referral to Orthopedic Surgery  4. Left Hip Pain  -Chronic ongoing. Previously thought to be sciatica. Including with orthopedic referral given pain has never resolved.   Follow Up Instructions: RTC: scheduled follow-up to recheck lipids and A1C   Meds ordered this encounter  Medications  . tiZANidine (ZANAFLEX) 4 MG capsule    Sig: Take 1 capsule (4 mg total) by mouth 2 (two) times daily as needed for muscle spasms.    Dispense:  60 capsule    Refill:  1  . ondansetron (ZOFRAN ODT) 4 MG disintegrating tablet    Sig: Take 1 tablet (4 mg total) by mouth every 8 (eight) hours as needed for nausea or vomiting.    Dispense:  20 tablet    Refill:  1  . naproxen (NAPROSYN) 500 MG tablet    Sig: Take 1 tablet (500 mg total) by mouth 2 (two) times daily with a meal.  Dispense:  60 tablet    Refill:  1      I discussed the assessment and treatment plan with the patient. The patient was provided an opportunity to ask questions and all were answered. The patient agreed with the plan and demonstrated an understanding of the instructions.   The patient was advised to call back or seek an in-person evaluation if the symptoms worsen or if the condition  fails to improve as anticipated.  I provided 25 minutes of non-face-to-face time during this encounter.   Molli Barrows, FNP

## 2018-10-20 ENCOUNTER — Telehealth: Payer: Self-pay | Admitting: Family Medicine

## 2018-10-20 NOTE — Telephone Encounter (Signed)
Called [phone]) to let them know that office note was completed.

## 2018-10-20 NOTE — Telephone Encounter (Signed)
Patient called to inform us that neurology has not been able to schedule appointment because it looks like the notes from 10/15/2018 are incomplete, please follow up.

## 2018-10-21 ENCOUNTER — Encounter: Payer: Self-pay | Admitting: Family Medicine

## 2018-10-27 ENCOUNTER — Ambulatory Visit (INDEPENDENT_AMBULATORY_CARE_PROVIDER_SITE_OTHER): Payer: BC Managed Care – PPO

## 2018-10-27 ENCOUNTER — Ambulatory Visit (INDEPENDENT_AMBULATORY_CARE_PROVIDER_SITE_OTHER): Payer: BC Managed Care – PPO | Admitting: Orthopaedic Surgery

## 2018-10-27 ENCOUNTER — Other Ambulatory Visit: Payer: Self-pay

## 2018-10-27 ENCOUNTER — Encounter: Payer: Self-pay | Admitting: Orthopaedic Surgery

## 2018-10-27 VITALS — Ht 69.0 in | Wt 184.0 lb

## 2018-10-27 DIAGNOSIS — M5020 Other cervical disc displacement, unspecified cervical region: Secondary | ICD-10-CM | POA: Diagnosis not present

## 2018-10-27 DIAGNOSIS — M542 Cervicalgia: Secondary | ICD-10-CM

## 2018-10-27 DIAGNOSIS — M4722 Other spondylosis with radiculopathy, cervical region: Secondary | ICD-10-CM

## 2018-10-27 MED ORDER — TRAMADOL HCL 50 MG PO TABS: 50.0000 mg | ORAL_TABLET | Freq: Four times a day (QID) | ORAL | 0 refills | Status: DC | PRN

## 2018-10-27 NOTE — Progress Notes (Signed)
Office Visit Note   Patient: Paige Dunn           Date of Birth: Aug 17, 1980           MRN: [id number] Visit Date: 10/27/2018              Requested by: Scot Jun, [address],  [place] [address] PCP: Scot Jun, FNP   Assessment & Plan: Visit Diagnoses:  1. Neck pain   2. Other spondylosis with radiculopathy, cervical region   3. Protrusion of cervical intervertebral disc     Plan: Ultram prescribed for pain.  We will set up with home cervical traction she can use intermittently during the day to get some relief.  We discussed options with patient significant pain and arm weakness which has progressed over the last 2 weeks she would like to proceed with surgical intervention.  We discussed treatment options for cervical spondylosis and central and left foraminal compression.  Recommendation would be two-level cervical fusion C5-6, C6-7 with allograft and plate, overnight stay in the hospital.  Use of a collar for 6 weeks postop.  Risk of dysphasia, dysphonia, pseudoarthrosis, possible need for posterior cervical fusion if her fusion did not heal which is unlikely.  Questions elicited and answered she understands and requests we proceed.  Follow-Up Instructions: Return pre-op.   Orders:  Orders Placed This Encounter  Procedures  . XR Cervical Spine 2 or 3 views   Meds ordered this encounter  Medications  . traMADol (ULTRAM) 50 MG tablet    Sig: Take 1 tablet (50 mg total) by mouth every 6 (six) hours as needed.    Dispense:  30 tablet    Refill:  0      Procedures: No procedures performed   Clinical Data: No additional findings.   Subjective: Chief Complaint  Patient presents with  . Neck - Pain  . Left Arm - Pain    HPI Paige Dunn pronounce like "IYA" is a 38 yo female with acute onset of left-sided neck pain and left arm pain and numbness that began at the beginning of the month.  She was seen in the emergency room 10/09/2018  placed on prednisone taper and gabapentin with some temporary relief.  Patient denies any lower extremity numbness or tingling.  Pain radiates from her neck into her left shoulder blade down her left arm with numbness in the thumb index and middle finger.  She has significant pain when she rotates her neck left arm only.  No right arm symptoms.  She is been treated by chiropractor with an hour to relief in her neck but persistent pain in her arm with numbness and weakness.  Patient did notice weakness with pushing on her arm and MRI scan was obtained on 10/09/2018 which showed broad-based disc protrusion at C5-6 left greater than right foraminal stenosis.  C6-7 showed broad-based disc protrusion left-sided uncinate spurring flattening of the ventral thecal sac and left foraminal stenosis.  Patient's had difficulty sleeping she has problems when she backs up a car since she is not able to turn her neck.  She has had to take Naprosyn and Flexeril prednisone pack Zanaflex Neurontin all without relief.  Patient states her neck and arm pain is moderate to severe.  Patient was worked up for cardiac problems with the neck left arm and pain that radiated into her shoulder blade which was negative.  Patient noted blurred vision with gabapentin.  Patient is a  high school Psychologist, prison and probation services.  Patient states she has had trouble pushing car doors closed numbness in her hand on the left and has been dropping objects with her left hand.  Review of Systems patient has past history of a C-section delivery history of seizures diagnosed 2014 ,on no medications.  Fibrocystic breast disease.  Past history of sciatica on the left side.  Posterior neck pain left arm numbness and weakness.  Patient is a non-smoker.   Objective: Vital Signs: Ht 5\' 9"  (1.753 m)   Wt 184 lb (83.5 kg)   BMI 27.17 kg/m   Physical Exam Constitutional:      Appearance: She is well-developed.  HENT:     Head: Normocephalic.     Right Ear: External ear  normal.     Left Ear: External ear normal.  Eyes:     Pupils: Pupils are equal, round, and reactive to light.  Neck:     Thyroid: No thyromegaly.     Trachea: No tracheal deviation.  Cardiovascular:     Rate and Rhythm: Normal rate.  Pulmonary:     Effort: Pulmonary effort is normal.  Abdominal:     Palpations: Abdomen is soft.  Skin:    General: Skin is warm and dry.  Neurological:     Mental Status: She is alert and oriented to person, place, and time.  Psychiatric:        Behavior: Behavior normal.     Ortho Exam patient has severe left brachial plexus tenderness negative on the right positive Spurling on the left.  No supraclavicular lymphadenopathy.  She gets some relief with distraction cervical spine increased pain and radicular left arm pain with cervical compression.  Biceps reflexes 2+ and symmetrical.  Patient has absent brachial radialis on the left and absent triceps on the left.  2+ on the right.  Patient has significant left triceps weakness without atrophy on the left normal on the right.  No biceps weakness right or left.  Mild wrist flexion weakness on the left normal on the right.  No finger extension or finger flexion weakness.  No thenar or hyperthenar atrophy.  Interossei are strong.  Negative Phalen's test left and right.  Negative carpal compression test.  Ulnar nerve the cubital tunnel is normal.  Specialty Comments:  No specialty comments available.  Imaging:CLINICAL DATA:  Neck and left shoulder pain for 2 days.  EXAM: MRI CERVICAL SPINE WITHOUT AND WITH CONTRAST  TECHNIQUE: Multiplanar and multiecho pulse sequences of the cervical spine, to include the craniocervical junction and cervicothoracic junction, were obtained without and with intravenous contrast.  CONTRAST:  8 cc Gadavist  COMPARISON:  None.  FINDINGS: Alignment: Mild reversal of the normal cervical lordosis could be due to positioning, muscle spasm or pain. The overall alignment  is maintained.  Vertebrae: No bone lesions or fractures.  Cord: Normal cord signal intensity. No cord lesions or syrinx. No areas of abnormal cord enhancement. No Chiari malformation.  Posterior Fossa, vertebral arteries, paraspinal tissues: No significant findings. Largely empty sella is noted.  Disc levels:  C2-3: No significant findings.  C3-4: No significant findings.  C4-5: Mild annular bulge with slight flattening of the ventral thecal sac and mild narrowing the ventral CSF space but no spinal or foraminal stenosis.  C5-6: Broad-based disc protrusion, osteophytic ridging and uncinate spurring with mass effect on the thecal sac and narrowing the ventral CSF space. There is also moderate right foraminal stenosis likely effecting the right C6 nerve root.  C6-7:  Broad-based disc protrusion and left-sided uncinate spurring with flattening of the ventral thecal sac and narrowing the ventral CSF space and mild left foraminal stenosis possibly irritating the left C7 nerve root.  C7-T1: No significant findings.  IMPRESSION: 1. Broad-based disc protrusions at C5-6 and C6-7. 2. Right foraminal stenosis at C5-6 and left foraminal stenosis at C6-7.   Electronically Signed   By: Marijo Sanes M.D.   On: 10/09/2018 13:46     PMFS History: Patient Active Problem List   Diagnosis Date Noted  . Other spondylosis with radiculopathy, cervical region 10/29/2018  . Protrusion of cervical intervertebral disc 10/29/2018  . Sciatica of left side 05/01/2018  . Seizure disorder, primary generalized ([place]) 12/09/2012   Past Medical History:  Diagnosis Date  . Cesarean delivery delivered   . Epilepsy ([place])   . Fibrocystic breast disease   . Headache(784.0)    menstrual  . Seizures ([place])     Family History  Problem Relation Age of Onset  . Heart attack Father   . Hypertension Maternal Grandmother     Past Surgical History:  Procedure Laterality Date  .  CESAREAN SECTION    . DILATION AND CURETTAGE OF UTERUS    . DILATION AND EVACUATION  08/16/2011   Procedure: DILATATION AND EVACUATION;  Surgeon: Alwyn Pea, MD;  Location: [facility];  Service: Gynecology;  Laterality: N/A;  . HYSTEROSCOPY  2004  . LAPAROSCOPY  2004   Social History   Occupational History  . Not on file  Tobacco Use  . Smoking status: Never Smoker  . Smokeless tobacco: Never Used  Substance and Sexual Activity  . Alcohol use: No  . Drug use: No  . Sexual activity: Yes    Partners: Male

## 2018-10-28 ENCOUNTER — Encounter: Payer: Self-pay | Admitting: Orthopaedic Surgery

## 2018-10-29 ENCOUNTER — Encounter: Payer: Self-pay | Admitting: Orthopaedic Surgery

## 2018-10-29 DIAGNOSIS — M5020 Other cervical disc displacement, unspecified cervical region: Secondary | ICD-10-CM | POA: Insufficient documentation

## 2018-10-29 DIAGNOSIS — M4722 Other spondylosis with radiculopathy, cervical region: Secondary | ICD-10-CM | POA: Insufficient documentation

## 2018-10-29 NOTE — Telephone Encounter (Signed)
Patient would like copies of cervical spine x-rays mailed to her home address. Could you please do this for me today since I am in [place] office. She only needs paper copies. Home address is correct in the chart. Thanks.

## 2018-11-05 ENCOUNTER — Telehealth: Payer: Self-pay | Admitting: Orthopaedic Surgery

## 2018-11-05 NOTE — Telephone Encounter (Signed)
Patient called stated that she had 2nd opinion and there is another spot on her spine and wanted to know if both sides could be repaired instead of one  Please call patient [phone]

## 2018-11-05 NOTE — Telephone Encounter (Signed)
Please advise 

## 2018-11-05 NOTE — Telephone Encounter (Signed)
I called discussed. We were scheduled for 2 levels . FYI

## 2018-11-06 ENCOUNTER — Other Ambulatory Visit: Payer: Self-pay | Admitting: Family Medicine

## 2018-11-09 ENCOUNTER — Telehealth: Payer: Self-pay | Admitting: Family Medicine

## 2018-11-09 NOTE — Telephone Encounter (Signed)
Received medical clearance forms for surgery. Patient is being contacted by front office to schedule an appointment this week.

## 2018-11-10 ENCOUNTER — Telehealth: Payer: Self-pay

## 2018-11-10 ENCOUNTER — Other Ambulatory Visit: Payer: Self-pay | Admitting: Family Medicine

## 2018-11-10 NOTE — Telephone Encounter (Signed)
Please see note from pharmacy

## 2018-11-10 NOTE — Telephone Encounter (Signed)
Called patient to do their pre-visit COVID screening.  Received error message when attempting to contact patient. Unable to make call or leave voicemail.

## 2018-11-11 ENCOUNTER — Ambulatory Visit: Payer: BC Managed Care – PPO | Admitting: Surgery

## 2018-11-11 ENCOUNTER — Ambulatory Visit: Payer: BC Managed Care – PPO | Admitting: Family Medicine

## 2018-11-12 ENCOUNTER — Other Ambulatory Visit: Payer: Self-pay

## 2018-11-12 ENCOUNTER — Telehealth: Payer: Self-pay | Admitting: Surgery

## 2018-11-12 ENCOUNTER — Encounter: Payer: Self-pay | Admitting: Surgery

## 2018-11-12 ENCOUNTER — Ambulatory Visit (INDEPENDENT_AMBULATORY_CARE_PROVIDER_SITE_OTHER): Payer: BC Managed Care – PPO | Admitting: Surgery

## 2018-11-12 VITALS — BP 104/70 | HR 73 | Ht 69.0 in | Wt 184.0 lb

## 2018-11-12 DIAGNOSIS — M5020 Other cervical disc displacement, unspecified cervical region: Secondary | ICD-10-CM

## 2018-11-12 NOTE — Progress Notes (Signed)
38 year old female with history of C5-6 and C6-7 HNP/gnosis comes in for preop evaluation for two-level fusion.  Neck pain and left upper extremity radiculopathy unchanged from previous visit.  She is wanting to proceed with surgery as scheduled.  Patient is going for preop medical clearance today with Molli Barrows.  Patient has documented history of seizure disorder but states that her last seizure was 2014.  Was last on medication for seizures 2018 and then this was discontinued.  Surgical procedure discussed in detail along with potential rehab/recovery time.  All questions answered.

## 2018-11-12 NOTE — Telephone Encounter (Signed)
Pt called in said she forgot to ask owens that he mentioned that dr.yates would be adding bone spurs and she had a question about that procedure.  Please give her a call [phone]

## 2018-11-12 NOTE — Telephone Encounter (Signed)
Please advise 

## 2018-11-16 ENCOUNTER — Ambulatory Visit
Admission: RE | Admit: 2018-11-16 | Discharge: 2018-11-16 | Disposition: A | Discharge status: Home / Self Care | Payer: BC Managed Care – PPO | Source: Ambulatory Visit | Attending: Family Medicine | Admitting: Family Medicine

## 2018-11-16 ENCOUNTER — Other Ambulatory Visit: Payer: Self-pay

## 2018-11-16 ENCOUNTER — Telehealth: Payer: Self-pay

## 2018-11-16 DIAGNOSIS — Z1239 Encounter for other screening for malignant neoplasm of breast: Secondary | ICD-10-CM

## 2018-11-16 NOTE — Telephone Encounter (Signed)

## 2018-11-17 ENCOUNTER — Ambulatory Visit: Payer: BC Managed Care – PPO | Admitting: Family Medicine

## 2018-11-17 ENCOUNTER — Encounter (HOSPITAL_COMMUNITY)
Admission: RE | Admit: 2018-11-17 | Discharge: 2018-11-17 | Disposition: A | Discharge status: Home / Self Care | Payer: BC Managed Care – PPO | Source: Ambulatory Visit | Attending: Orthopaedic Surgery | Admitting: Orthopaedic Surgery

## 2018-11-17 ENCOUNTER — Encounter (HOSPITAL_COMMUNITY): Payer: Self-pay

## 2018-11-17 ENCOUNTER — Other Ambulatory Visit: Payer: Self-pay

## 2018-11-17 DIAGNOSIS — Z01812 Encounter for preprocedural laboratory examination: Secondary | ICD-10-CM | POA: Insufficient documentation

## 2018-11-17 DIAGNOSIS — Z0289 Encounter for other administrative examinations: Secondary | ICD-10-CM

## 2018-11-17 HISTORY — DX: Unspecified osteoarthritis, unspecified site: M19.90

## 2018-11-17 LAB — URINALYSIS, ROUTINE W REFLEX MICROSCOPIC
Bilirubin Urine: NEGATIVE
Glucose, UA: NEGATIVE mg/dL
Hgb urine dipstick: NEGATIVE
Ketones, ur: NEGATIVE mg/dL
Nitrite: NEGATIVE
Protein, ur: NEGATIVE mg/dL
Specific Gravity, Urine: 1.006 (ref 1.005–1.030)
pH: 8 (ref 5.0–8.0)

## 2018-11-17 LAB — COMPREHENSIVE METABOLIC PANEL
ALT: 20 U/L (ref 0–44)
AST: 19 U/L (ref 15–41)
Albumin: 4 g/dL (ref 3.5–5.0)
Alkaline Phosphatase: 64 U/L (ref 38–126)
Anion gap: 8 (ref 5–15)
BUN: 5 mg/dL — ABNORMAL LOW (ref 6–20)
CO2: 26 mmol/L (ref 22–32)
Calcium: 9.3 mg/dL (ref 8.9–10.3)
Chloride: 105 mmol/L (ref 98–111)
Creatinine, Ser: 0.6 mg/dL (ref 0.44–1.00)
GFR calc Af Amer: >60 mL/min (ref >60)
GFR calc non Af Amer: >60 mL/min (ref >60)
Glucose, Bld: 95 mg/dL (ref 70–99)
Potassium: 4 mmol/L (ref 3.5–5.1)
Sodium: 139 mmol/L (ref 135–145)
Total Bilirubin: 0.7 mg/dL (ref 0.3–1.2)
Total Protein: 7.3 g/dL (ref 6.5–8.1)

## 2018-11-17 LAB — CBC
HCT: 38.6 % (ref 36.0–46.0)
Hemoglobin: 12.5 g/dL (ref 12.0–15.0)
MCH: 27.7 pg (ref 26.0–34.0)
MCHC: 32.4 g/dL (ref 30.0–36.0)
MCV: 85.4 fL (ref 80.0–100.0)
Platelets: 370 10*3/uL (ref 150–400)
RBC: 4.52 MIL/uL (ref 3.87–5.11)
RDW: 12.6 % (ref 11.5–15.5)
WBC: 5.6 10*3/uL (ref 4.0–10.5)
nRBC: 0 % (ref 0.0–0.2)

## 2018-11-17 LAB — SURGICAL PCR SCREEN
MRSA, PCR: NEGATIVE
Staphylococcus aureus: NEGATIVE

## 2018-11-17 NOTE — Progress Notes (Signed)
PCP - Molli Barrows, FNP Cardiologist - na  Chest x-ray - 06-04-18 EKG - 10-12-18 Stress Test - na ECHO - na Cardiac Cath - na  Sleep Study - na CPAP -   Fasting Blood Sugar - na Checks Blood Sugar _____ times a day  Blood Thinner Instructions: Aspirin Instructions:na  Anesthesia review:   Patient denies shortness of breath, fever, cough and chest pain at PAT appointment   Patient verbalized understanding of instructions that were given to them at the PAT appointment. Patient was also instructed that they will need to review over the PAT instructions again at home before surgery.

## 2018-11-17 NOTE — Pre-Procedure Instructions (Addendum)
Paige Dunn  11/17/2018      [id number] - [place], [place] [address] Phone: [phone] Fax: [phone]    Your procedure is scheduled on July 20  Report to [facility] Entrance A at 10:30 A.M.  Call this number if you have problems the morning of surgery:  [phone]   Remember:    Do not eat  after midnight.              You may have clear liquids until 9:30 A.M. Clear Liquids: water, carbonated beverages, clear tea, black coffee only, popsicles, gaterade, juice -non citric and without pulp.                Please complete your PRE-SURGERY ENSURE that was provided to you by 9:30 A.M. the morning of surgery.  Please, if able, drink it in one setting. DO NOT SIP.      Take these medicines the morning of surgery with A SIP OF WATER:              zofran if needed             Tizanidine (zanaflex) if needed              Tramadol if needed                7 days prior to surgery STOP taking any Aspirin (unless otherwise instructed by your surgeon), Aleve, Naproxen, Ibuprofen, Motrin, Advil, Goody's, BC's, all herbal medications, fish oil, and all vitamins.    Do not wear jewelry, make-up or nail polish.  Do not wear lotions, powders, or perfumes, or deodorant.  Do not shave 48 hours prior to surgery.  Men may shave face and neck.  Do not bring valuables to the hospital.  [facility] is not responsible for any belongings or valuables.  Contacts, dentures or bridgework may not be worn into surgery.  Leave your suitcase in the car.  After surgery it may be brought to your room.  For patients admitted to the hospital, discharge time will be determined by your treatment team.  Patients discharged the day of surgery will not be allowed to drive home.    Special instructions:   [place]- Preparing For Surgery  Before surgery, you can play an important role. Because skin is not sterile, your skin needs to  be as free of germs as possible. You can reduce the number of germs on your skin by washing with CHG (chlorahexidine gluconate) Soap before surgery.  CHG is an antiseptic cleaner which kills germs and bonds with the skin to continue killing germs even after washing.    Oral Hygiene is also important to reduce your risk of infection.  Remember - BRUSH YOUR TEETH THE MORNING OF SURGERY WITH YOUR REGULAR TOOTHPASTE  Please do not use if you have an allergy to CHG or antibacterial soaps. If your skin becomes reddened/irritated stop using the CHG.  Do not shave (including legs and underarms) for at least 48 hours prior to first CHG shower. It is OK to shave your face.  Please follow these instructions carefully.   1. Shower the NIGHT BEFORE SURGERY and the MORNING OF SURGERY with CHG.   2. If you chose to wash your hair, wash your hair first as usual with your normal shampoo.  3. After you shampoo, rinse your hair and body thoroughly to remove the shampoo.  4. Use CHG  as you would any other liquid soap. You can apply CHG directly to the skin and wash gently with a scrungie or a clean washcloth.   5. Apply the CHG Soap to your body ONLY FROM THE NECK DOWN.  Do not use on open wounds or open sores. Avoid contact with your eyes, ears, mouth and genitals (private parts). Wash Face and genitals (private parts)  with your normal soap.  6. Wash thoroughly, paying special attention to the area where your surgery will be performed.  7. Thoroughly rinse your body with warm water from the neck down.  8. DO NOT shower/wash with your normal soap after using and rinsing off the CHG Soap.  9. Pat yourself dry with a CLEAN TOWEL.  10. Wear CLEAN PAJAMAS to bed the night before surgery, wear comfortable clothes the morning of surgery  11. Place CLEAN SHEETS on your bed the night of your first shower and DO NOT SLEEP WITH PETS.    Day of Surgery:  Do not apply any deodorants/lotions.  Please wear clean  clothes to the hospital/surgery center.   Remember to brush your teeth WITH YOUR REGULAR TOOTHPASTE.    Please read over the following fact sheets that you were given. Coughing and Deep Breathing, MRSA Information and Surgical Site Infection Prevention

## 2018-11-17 NOTE — Progress Notes (Signed)
Need PCP to complete form and letter for surgical clearance. Pre-op labs completed today at hospital. Review labs-no concerns. ECG recent less than 30 days, NSR. Letter provided to patient and form faxed attn Betty at Dr. Lorin Mercy office.

## 2018-11-18 ENCOUNTER — Other Ambulatory Visit: Payer: Self-pay | Admitting: Family Medicine

## 2018-11-18 ENCOUNTER — Other Ambulatory Visit: Payer: Self-pay

## 2018-11-18 ENCOUNTER — Other Ambulatory Visit: Payer: Self-pay | Admitting: Orthopaedic Surgery

## 2018-11-18 DIAGNOSIS — R928 Other abnormal and inconclusive findings on diagnostic imaging of breast: Secondary | ICD-10-CM

## 2018-11-18 NOTE — Telephone Encounter (Signed)
Please advise 

## 2018-11-18 NOTE — Telephone Encounter (Signed)
Ok refill thanks 

## 2018-11-19 ENCOUNTER — Other Ambulatory Visit: Payer: Self-pay

## 2018-11-19 ENCOUNTER — Ambulatory Visit: Payer: BC Managed Care – PPO

## 2018-11-19 ENCOUNTER — Other Ambulatory Visit (HOSPITAL_COMMUNITY)
Admission: RE | Admit: 2018-11-19 | Discharge: 2018-11-19 | Disposition: A | Discharge status: Home / Self Care | Payer: BC Managed Care – PPO | Source: Ambulatory Visit | Attending: Orthopaedic Surgery | Admitting: Orthopaedic Surgery

## 2018-11-19 ENCOUNTER — Ambulatory Visit
Admission: RE | Admit: 2018-11-19 | Discharge: 2018-11-19 | Disposition: A | Discharge status: Home / Self Care | Payer: BC Managed Care – PPO | Source: Ambulatory Visit | Attending: Family Medicine | Admitting: Family Medicine

## 2018-11-19 DIAGNOSIS — R928 Other abnormal and inconclusive findings on diagnostic imaging of breast: Secondary | ICD-10-CM

## 2018-11-19 DIAGNOSIS — Z1159 Encounter for screening for other viral diseases: Secondary | ICD-10-CM | POA: Diagnosis not present

## 2018-11-19 LAB — SARS CORONAVIRUS 2 (TAT 6-24 HRS): SARS Coronavirus 2: NEGATIVE

## 2018-11-23 ENCOUNTER — Encounter (HOSPITAL_COMMUNITY): Payer: Self-pay

## 2018-11-23 ENCOUNTER — Ambulatory Visit (HOSPITAL_COMMUNITY): Payer: BC Managed Care – PPO

## 2018-11-23 ENCOUNTER — Ambulatory Visit (HOSPITAL_COMMUNITY): Payer: BC Managed Care – PPO | Admitting: Certified Registered"

## 2018-11-23 ENCOUNTER — Encounter (HOSPITAL_COMMUNITY)
Admission: RE | Disposition: A | Discharge status: Home / Self Care | Payer: Self-pay | Source: Home / Self Care | Attending: Orthopaedic Surgery

## 2018-11-23 ENCOUNTER — Observation Stay (HOSPITAL_COMMUNITY)
Admission: RE | Admit: 2018-11-23 | Discharge: 2018-11-24 | Disposition: A | Discharge status: Home / Self Care | Payer: BC Managed Care – PPO | Attending: Orthopaedic Surgery | Admitting: Orthopaedic Surgery

## 2018-11-23 ENCOUNTER — Other Ambulatory Visit: Payer: Self-pay

## 2018-11-23 ENCOUNTER — Encounter: Payer: Self-pay | Admitting: Neurology

## 2018-11-23 DIAGNOSIS — M50122 Cervical disc disorder at C5-C6 level with radiculopathy: Secondary | ICD-10-CM | POA: Diagnosis not present

## 2018-11-23 DIAGNOSIS — M4802 Spinal stenosis, cervical region: Secondary | ICD-10-CM | POA: Diagnosis not present

## 2018-11-23 DIAGNOSIS — Z419 Encounter for procedure for purposes other than remedying health state, unspecified: Secondary | ICD-10-CM

## 2018-11-23 DIAGNOSIS — M5020 Other cervical disc displacement, unspecified cervical region: Secondary | ICD-10-CM | POA: Diagnosis present

## 2018-11-23 DIAGNOSIS — M542 Cervicalgia: Secondary | ICD-10-CM | POA: Diagnosis present

## 2018-11-23 HISTORY — PX: ANTERIOR CERVICAL DECOMP/DISCECTOMY FUSION: SHX1161

## 2018-11-23 LAB — POCT PREGNANCY, URINE: Preg Test, Ur: NEGATIVE

## 2018-11-23 SURGERY — ANTERIOR CERVICAL DECOMPRESSION/DISCECTOMY FUSION 2 LEVELS
Anesthesia: General

## 2018-11-23 MED ORDER — DEXAMETHASONE SODIUM PHOSPHATE 10 MG/ML IJ SOLN
INTRAMUSCULAR | Status: DC | PRN
  Administered 2018-11-23: 10 mg via INTRAVENOUS

## 2018-11-23 MED ORDER — SODIUM CHLORIDE 0.9 % IV SOLN
INTRAVENOUS | Status: DC
  Administered 2018-11-23: 17:00:00 via INTRAVENOUS

## 2018-11-23 MED ORDER — LIDOCAINE 2% (20 MG/ML) 5 ML SYRINGE
INTRAMUSCULAR | Status: AC
  Filled 2018-11-23: qty 5

## 2018-11-23 MED ORDER — PROPOFOL 10 MG/ML IV BOLUS
INTRAVENOUS | Status: AC
  Filled 2018-11-23: qty 20

## 2018-11-23 MED ORDER — FENTANYL CITRATE (PF) 100 MCG/2ML IJ SOLN
INTRAMUSCULAR | Status: AC
  Filled 2018-11-23: qty 2

## 2018-11-23 MED ORDER — SODIUM CHLORIDE 0.9% FLUSH: 3.0000 mL | Freq: Two times a day (BID) | INTRAVENOUS | Status: DC

## 2018-11-23 MED ORDER — ONDANSETRON HCL 4 MG PO TABS
4.0000 mg | ORAL_TABLET | Freq: Four times a day (QID) | ORAL | Status: DC | PRN
  Administered 2018-11-23 – 2018-11-24 (×3): 4 mg via ORAL
  Filled 2018-11-23 (×3): qty 1

## 2018-11-23 MED ORDER — ONDANSETRON HCL 4 MG/2ML IJ SOLN
INTRAMUSCULAR | Status: AC
  Filled 2018-11-23: qty 2

## 2018-11-23 MED ORDER — ONDANSETRON HCL 4 MG/2ML IJ SOLN
INTRAMUSCULAR | Status: DC | PRN
  Administered 2018-11-23: 4 mg via INTRAVENOUS

## 2018-11-23 MED ORDER — GABAPENTIN 300 MG PO CAPS
300.0000 mg | ORAL_CAPSULE | Freq: Every day | ORAL | Status: DC
  Administered 2018-11-23: 20:00:00 300 mg via ORAL
  Filled 2018-11-23: qty 1

## 2018-11-23 MED ORDER — SODIUM CHLORIDE 0.9 % IV SOLN: 250.0000 mL | INTRAVENOUS | Status: DC

## 2018-11-23 MED ORDER — HYDROXYZINE HCL 50 MG/ML IM SOLN
50.0000 mg | Freq: Four times a day (QID) | INTRAMUSCULAR | Status: DC | PRN
  Administered 2018-11-23: 18:00:00 50 mg via INTRAMUSCULAR
  Filled 2018-11-23: qty 1

## 2018-11-23 MED ORDER — ACETAMINOPHEN 325 MG PO TABS: 650.0000 mg | ORAL_TABLET | ORAL | Status: DC | PRN

## 2018-11-23 MED ORDER — WHITE PETROLATUM EX OINT
TOPICAL_OINTMENT | CUTANEOUS | Status: AC
  Administered 2018-11-23: 17:00:00
  Filled 2018-11-23: qty 28.35

## 2018-11-23 MED ORDER — FENTANYL CITRATE (PF) 100 MCG/2ML IJ SOLN
INTRAMUSCULAR | Status: DC | PRN
  Administered 2018-11-23: 100 ug via INTRAVENOUS
  Administered 2018-11-23 (×6): 50 ug via INTRAVENOUS
  Administered 2018-11-23: 100 ug via INTRAVENOUS

## 2018-11-23 MED ORDER — SUGAMMADEX SODIUM 200 MG/2ML IV SOLN
INTRAVENOUS | Status: DC | PRN
  Administered 2018-11-23: 180 mg via INTRAVENOUS

## 2018-11-23 MED ORDER — CHLORHEXIDINE GLUCONATE 4 % EX LIQD: 60.0000 mL | Freq: Once | CUTANEOUS | Status: DC

## 2018-11-23 MED ORDER — METHOCARBAMOL 500 MG PO TABS
500.0000 mg | ORAL_TABLET | Freq: Four times a day (QID) | ORAL | Status: DC | PRN
  Administered 2018-11-23 – 2018-11-24 (×2): 500 mg via ORAL
  Filled 2018-11-23: qty 1

## 2018-11-23 MED ORDER — CEFAZOLIN SODIUM-DEXTROSE 1-4 GM/50ML-% IV SOLN
1.0000 g | Freq: Three times a day (TID) | INTRAVENOUS | Status: AC
  Administered 2018-11-23 – 2018-11-24 (×2): 1 g via INTRAVENOUS
  Filled 2018-11-23 (×2): qty 50

## 2018-11-23 MED ORDER — MIDAZOLAM HCL 2 MG/2ML IJ SOLN
INTRAMUSCULAR | Status: AC
  Filled 2018-11-23: qty 2

## 2018-11-23 MED ORDER — FENTANYL CITRATE (PF) 100 MCG/2ML IJ SOLN
25.0000 ug | INTRAMUSCULAR | Status: DC | PRN
  Administered 2018-11-23: 16:00:00 25 ug via INTRAVENOUS

## 2018-11-23 MED ORDER — ROCURONIUM BROMIDE 10 MG/ML (PF) SYRINGE
PREFILLED_SYRINGE | INTRAVENOUS | Status: AC
  Filled 2018-11-23: qty 10

## 2018-11-23 MED ORDER — PHENOL 1.4 % MT LIQD: 1.0000 | OROMUCOSAL | Status: DC | PRN

## 2018-11-23 MED ORDER — POLYETHYLENE GLYCOL 3350 17 G PO PACK: 17.0000 g | PACK | Freq: Every day | ORAL | Status: DC

## 2018-11-23 MED ORDER — DEXAMETHASONE SODIUM PHOSPHATE 10 MG/ML IJ SOLN
INTRAMUSCULAR | Status: AC
  Filled 2018-11-23: qty 1

## 2018-11-23 MED ORDER — OXYCODONE HCL 5 MG/5ML PO SOLN: 5.0000 mg | Freq: Once | ORAL | Status: AC | PRN

## 2018-11-23 MED ORDER — SODIUM CHLORIDE 0.9% FLUSH: 3.0000 mL | INTRAVENOUS | Status: DC | PRN

## 2018-11-23 MED ORDER — ACETAMINOPHEN 500 MG PO TABS: 1000.0000 mg | ORAL_TABLET | Freq: Once | ORAL | Status: DC | PRN

## 2018-11-23 MED ORDER — HYDROMORPHONE HCL 1 MG/ML IJ SOLN
0.5000 mg | INTRAMUSCULAR | Status: DC | PRN
  Administered 2018-11-23: 17:00:00 0.5 mg via INTRAVENOUS
  Filled 2018-11-23: qty 0.5

## 2018-11-23 MED ORDER — 0.9 % SODIUM CHLORIDE (POUR BTL) OPTIME
TOPICAL | Status: DC | PRN
  Administered 2018-11-23: 15:00:00 1000 mL

## 2018-11-23 MED ORDER — ACETAMINOPHEN 650 MG RE SUPP: 650.0000 mg | RECTAL | Status: DC | PRN

## 2018-11-23 MED ORDER — ACETAMINOPHEN 160 MG/5ML PO SOLN: 1000.0000 mg | Freq: Once | ORAL | Status: DC | PRN

## 2018-11-23 MED ORDER — CEFAZOLIN SODIUM-DEXTROSE 2-4 GM/100ML-% IV SOLN
2.0000 g | INTRAVENOUS | Status: AC
  Administered 2018-11-23: 2 g via INTRAVENOUS

## 2018-11-23 MED ORDER — METHOCARBAMOL 500 MG PO TABS
ORAL_TABLET | ORAL | Status: AC
  Filled 2018-11-23: qty 1

## 2018-11-23 MED ORDER — OXYCODONE HCL 5 MG PO TABS
ORAL_TABLET | ORAL | Status: AC
  Filled 2018-11-23: qty 1

## 2018-11-23 MED ORDER — BUPIVACAINE HCL (PF) 0.25 % IJ SOLN
INTRAMUSCULAR | Status: DC | PRN
  Administered 2018-11-23: 6 mL

## 2018-11-23 MED ORDER — FENTANYL CITRATE (PF) 250 MCG/5ML IJ SOLN
INTRAMUSCULAR | Status: AC
  Filled 2018-11-23: qty 5

## 2018-11-23 MED ORDER — LIDOCAINE HCL (CARDIAC) PF 100 MG/5ML IV SOSY
PREFILLED_SYRINGE | INTRAVENOUS | Status: DC | PRN
  Administered 2018-11-23: 60 mg via INTRATRACHEAL

## 2018-11-23 MED ORDER — ONDANSETRON HCL 4 MG/2ML IJ SOLN: 4.0000 mg | INTRAMUSCULAR | Status: DC | PRN

## 2018-11-23 MED ORDER — DOCUSATE SODIUM 100 MG PO CAPS
100.0000 mg | ORAL_CAPSULE | Freq: Two times a day (BID) | ORAL | Status: DC
  Administered 2018-11-23 – 2018-11-24 (×2): 100 mg via ORAL
  Filled 2018-11-23 (×2): qty 1

## 2018-11-23 MED ORDER — MENTHOL 3 MG MT LOZG
1.0000 | LOZENGE | OROMUCOSAL | Status: DC | PRN
  Administered 2018-11-23: 20:00:00 3 mg via ORAL
  Filled 2018-11-23: qty 9

## 2018-11-23 MED ORDER — HEMOSTATIC AGENTS (NO CHARGE) OPTIME
TOPICAL | Status: DC | PRN
  Administered 2018-11-23: 1 via TOPICAL

## 2018-11-23 MED ORDER — ACETAMINOPHEN 10 MG/ML IV SOLN: 1000.0000 mg | Freq: Once | INTRAVENOUS | Status: DC | PRN

## 2018-11-23 MED ORDER — PROPOFOL 10 MG/ML IV BOLUS
INTRAVENOUS | Status: DC | PRN
  Administered 2018-11-23: 160 mg via INTRAVENOUS

## 2018-11-23 MED ORDER — LACTATED RINGERS IV SOLN
INTRAVENOUS | Status: DC
  Administered 2018-11-23 (×2): via INTRAVENOUS

## 2018-11-23 MED ORDER — ROCURONIUM BROMIDE 100 MG/10ML IV SOLN
INTRAVENOUS | Status: DC | PRN
  Administered 2018-11-23: 40 mg via INTRAVENOUS
  Administered 2018-11-23: 60 mg via INTRAVENOUS

## 2018-11-23 MED ORDER — OXYCODONE HCL 5 MG PO TABS
5.0000 mg | ORAL_TABLET | ORAL | Status: DC | PRN
  Administered 2018-11-23 – 2018-11-24 (×4): 5 mg via ORAL
  Filled 2018-11-23 (×4): qty 1

## 2018-11-23 MED ORDER — THROMBIN (RECOMBINANT) 20000 UNITS EX SOLR
CUTANEOUS | Status: AC
  Filled 2018-11-23: qty 20000

## 2018-11-23 MED ORDER — OXYCODONE HCL 5 MG PO TABS
5.0000 mg | ORAL_TABLET | Freq: Once | ORAL | Status: AC | PRN
  Administered 2018-11-23: 16:00:00 5 mg via ORAL

## 2018-11-23 MED ORDER — CEFAZOLIN SODIUM-DEXTROSE 2-4 GM/100ML-% IV SOLN
INTRAVENOUS | Status: AC
  Filled 2018-11-23: qty 100

## 2018-11-23 MED ORDER — OXYCODONE-ACETAMINOPHEN 5-325 MG PO TABS: 1.0000 | ORAL_TABLET | Freq: Four times a day (QID) | ORAL | 0 refills | Status: DC | PRN

## 2018-11-23 MED ORDER — BUPIVACAINE HCL (PF) 0.25 % IJ SOLN
INTRAMUSCULAR | Status: AC
  Filled 2018-11-23: qty 30

## 2018-11-23 MED ORDER — MIDAZOLAM HCL 5 MG/5ML IJ SOLN
INTRAMUSCULAR | Status: DC | PRN
  Administered 2018-11-23: 2 mg via INTRAVENOUS

## 2018-11-23 MED ORDER — ONDANSETRON HCL 4 MG PO TABS: 4.0000 mg | ORAL_TABLET | Freq: Four times a day (QID) | ORAL | Status: DC | PRN

## 2018-11-23 MED ORDER — ONDANSETRON HCL 4 MG/2ML IJ SOLN: 4.0000 mg | Freq: Four times a day (QID) | INTRAMUSCULAR | Status: DC | PRN

## 2018-11-23 MED ORDER — METHOCARBAMOL 1000 MG/10ML IJ SOLN
500.0000 mg | Freq: Four times a day (QID) | INTRAVENOUS | Status: DC | PRN
  Filled 2018-11-23: qty 5

## 2018-11-23 SURGICAL SUPPLY — 57 items
BENZOIN TINCTURE PRP APPL 2/3 (GAUZE/BANDAGES/DRESSINGS) ×3 IMPLANT
BIT DRILL SKYLINE 12MM (BIT) IMPLANT
BLADE CLIPPER SURG (BLADE) IMPLANT
BONE CERV LORDOTIC 14.5X12X7 (Bone Implant) ×6 IMPLANT
BUR ROUND FLUTED 4 SOFT TCH (BURR) IMPLANT
BUR ROUND FLUTED 4MM SOFT TCH (BURR)
CLOSURE WOUND 1/2 X4 (GAUZE/BANDAGES/DRESSINGS) ×1
COLLAR CERV LO CONTOUR FIRM DE (SOFTGOODS) IMPLANT
COLLAR CERV PROCARE ST 2.25 (SOFTGOODS) ×2 IMPLANT
CORD BIPOLAR FORCEPS 12FT (ELECTRODE) ×3 IMPLANT
COVER SURGICAL LIGHT HANDLE (MISCELLANEOUS) ×3 IMPLANT
COVER WAND RF STERILE (DRAPES) ×3 IMPLANT
DRAPE C-ARM 42X72 X-RAY (DRAPES) ×3 IMPLANT
DRAPE HALF SHEET 40X57 (DRAPES) ×3 IMPLANT
DRAPE MICROSCOPE LEICA (MISCELLANEOUS) ×3 IMPLANT
DRILL BIT SKYLINE 12MM (BIT) ×2
DURAPREP 6ML APPLICATOR 50/CS (WOUND CARE) ×3 IMPLANT
ELECT COATED BLADE 2.86 ST (ELECTRODE) ×3 IMPLANT
ELECT REM PT RETURN 9FT ADLT (ELECTROSURGICAL) ×3
ELECTRODE REM PT RTRN 9FT ADLT (ELECTROSURGICAL) ×1 IMPLANT
EVACUATOR 1/8 PVC DRAIN (DRAIN) ×3 IMPLANT
GAUZE SPONGE 4X4 12PLY STRL (GAUZE/BANDAGES/DRESSINGS) ×3 IMPLANT
GLOVE BIOGEL PI IND STRL 8 (GLOVE) ×2 IMPLANT
GLOVE BIOGEL PI INDICATOR 8 (GLOVE) ×4
GLOVE ORTHO TXT STRL SZ7.5 (GLOVE) ×6 IMPLANT
GOWN STRL REUS W/ TWL LRG LVL3 (GOWN DISPOSABLE) ×1 IMPLANT
GOWN STRL REUS W/ TWL XL LVL3 (GOWN DISPOSABLE) ×1 IMPLANT
GOWN STRL REUS W/TWL 2XL LVL3 (GOWN DISPOSABLE) ×3 IMPLANT
GOWN STRL REUS W/TWL LRG LVL3 (GOWN DISPOSABLE) ×2
GOWN STRL REUS W/TWL XL LVL3 (GOWN DISPOSABLE) ×2
GRAFT BNE SPCR VG2 14.5X12X7 (Bone Implant) IMPLANT
HALTER HD/CHIN CERV TRACTION D (MISCELLANEOUS) ×3 IMPLANT
HEMOSTAT SURGICEL 2X14 (HEMOSTASIS) IMPLANT
KIT BASIN OR (CUSTOM PROCEDURE TRAY) ×3 IMPLANT
KIT TURNOVER KIT B (KITS) ×3 IMPLANT
MANIFOLD NEPTUNE II (INSTRUMENTS) IMPLANT
NDL 25GX 5/8IN NON SAFETY (NEEDLE) ×1 IMPLANT
NEEDLE 25GX 5/8IN NON SAFETY (NEEDLE) ×3 IMPLANT
NS IRRIG 1000ML POUR BTL (IV SOLUTION) ×3 IMPLANT
PACK ORTHO CERVICAL (CUSTOM PROCEDURE TRAY) ×3 IMPLANT
PAD ARMBOARD 7.5X6 YLW CONV (MISCELLANEOUS) ×6 IMPLANT
PATTIES SURGICAL .5 X.5 (GAUZE/BANDAGES/DRESSINGS) IMPLANT
PIN TEMP SKYLINE THREADED (PIN) ×2 IMPLANT
PLATE SKYLINE TWO LEVEL 32MM (Plate) ×2 IMPLANT
POSITIONER HEAD DONUT 9IN (MISCELLANEOUS) ×3 IMPLANT
RESTRAINT LIMB HOLDER UNIV (RESTRAINTS) ×2 IMPLANT
SCREW VARIABLE SELF TAP 12MM (Screw) ×12 IMPLANT
STRIP CLOSURE SKIN 1/2X4 (GAUZE/BANDAGES/DRESSINGS) ×2 IMPLANT
SURGIFLO W/THROMBIN 8M KIT (HEMOSTASIS) ×2 IMPLANT
SUT BONE WAX W31G (SUTURE) ×3 IMPLANT
SUT VIC AB 3-0 X1 27 (SUTURE) ×3 IMPLANT
SUT VICRYL 4-0 PS2 18IN ABS (SUTURE) ×6 IMPLANT
TAPE CLOTH SURG 6X10 WHT LF (GAUZE/BANDAGES/DRESSINGS) ×2 IMPLANT
TOWEL GREEN STERILE (TOWEL DISPOSABLE) ×3 IMPLANT
TOWEL GREEN STERILE FF (TOWEL DISPOSABLE) ×3 IMPLANT
TRAY FOLEY W/BAG SLVR 16FR (SET/KITS/TRAYS/PACK)
TRAY FOLEY W/BAG SLVR 16FR ST (SET/KITS/TRAYS/PACK) IMPLANT

## 2018-11-23 NOTE — Interval H&P Note (Signed)
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  The various methods of treatment have been discussed with the patient and family. After consideration of risks, benefits and other options for treatment, the patient has consented to  Procedure(s): c5-6, c6-7 ANTERIOR CERVICAL DECOMPRESSION/DISCECTOMY FUSION, ALLOGRAFT, PLATE (N/A) as a surgical intervention.  The patient's history has been reviewed, patient examined, no change in status, stable for surgery.  I have reviewed the patient's chart and labs.  Questions were answered to the patient's satisfaction.     Marybelle Killings

## 2018-11-23 NOTE — Anesthesia Postprocedure Evaluation (Signed)
Anesthesia Post Note  Patient: Paige Dunn  Procedure(s) Performed: c5-6, c6-7 ANTERIOR CERVICAL DECOMPRESSION/DISCECTOMY FUSION, ALLOGRAFT, PLATE (N/A )     Patient location during evaluation: PACU Anesthesia Type: General Level of consciousness: awake and alert Pain management: pain level controlled Vital Signs Assessment: post-procedure vital signs reviewed and stable Respiratory status: spontaneous breathing, nonlabored ventilation, respiratory function stable and patient connected to nasal cannula oxygen Cardiovascular status: blood pressure returned to baseline and stable Postop Assessment: no apparent nausea or vomiting Anesthetic complications: no    Last Vitals:  Vitals:   11/23/18 1600 11/23/18 1626  BP: 139/73 133/80  Pulse: 81 75  Resp: 13 17  Temp: 36.9 C 36.8 C  SpO2: 100%     Last Pain:  Vitals:   11/23/18 1626  TempSrc: Oral  PainSc:                  Ryan P Ellender

## 2018-11-23 NOTE — H&P (Signed)
Paige Dunn is an 38 y.o. female.   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  Neck pain and left upper extremity radiculopathy unchanged from previous visit.  She is wanting to proceed with surgery as scheduled.  Patient is going for preop medical clearance today with Molli Barrows.  Patient has documented history of seizure disorder but states that her last seizure was 2014.  Was last on medication for seizures 2018 and then this was discontinued.   Past Medical History:  Diagnosis Date  . Arthritis    in spine  . Cesarean delivery delivered   . Epilepsy ([place])   . Fibrocystic breast disease   . Seizures ([place])    started 2014 the only seizure, stopped meds in 2018    Past Surgical History:  Procedure Laterality Date  . CERVICAL ABLATION  2015  . CESAREAN SECTION    . DILATION AND CURETTAGE OF UTERUS    . DILATION AND EVACUATION  08/16/2011   Procedure: DILATATION AND EVACUATION;  Surgeon: Alwyn Pea, MD;  Location: [facility];  Service: Gynecology;  Laterality: N/A;  . HYSTEROSCOPY  2004  . LAPAROSCOPY  2004    Family History  Problem Relation Age of Onset  . Heart attack Father   . Hypertension Maternal Grandmother    Social History:  reports that she has never smoked. She has never used smokeless tobacco. She reports that she does not drink alcohol or use drugs.  Allergies:  Allergies  Allergen Reactions  . Aspirin Nausea And Vomiting and Other (See Comments)    Tremors, dizziness, upset stomach  . Lamictal [Lamotrigine]     Neuromuscular discomfort    No medications prior to admission.    No results found for this or any previous visit (from the past 48 hour(s)). No results found.  Review of Systems  Constitutional: Negative.   HENT: Negative.   Respiratory: Negative.   Cardiovascular: Negative.   Gastrointestinal: Negative.    Genitourinary: Negative.   Musculoskeletal: Positive for neck pain.  Skin: Negative.   Neurological: Positive for tingling.  Psychiatric/Behavioral: Negative.     Last menstrual period 10/24/2018. Physical Exam  Constitutional: She is oriented to person, place, and time. She appears well-developed. No distress.  HENT:  Head: Normocephalic and atraumatic.  Eyes: Pupils are equal, round, and reactive to light. EOM are normal.  Cardiovascular: Normal rate.  Respiratory: Effort normal. No respiratory distress.  GI: Soft. Bowel sounds are normal. She exhibits no distension. There is no abdominal tenderness.  Musculoskeletal:        General: Tenderness present.  Neurological: She is alert and oriented to person, place, and time.  Skin: Skin is warm and dry.  Psychiatric: She has a normal mood and affect.     Assessment/Plan C5-6 and C6-7 HNP/stenosis, neck pain and left UE radiculopathy  We will proceed with C5-6 and C6-7 ACDF as scheduled.  Surgical procedure discussed along with possible rehab/recovery time.  All questions answered and she wishes to proceed.   Benjiman Core, PA-C 11/23/2018, 10:24 AM

## 2018-11-23 NOTE — Transfer of Care (Signed)
Immediate Anesthesia Transfer of Care Note  Patient: [name]  Procedure(s) Performed: c5-6, c6-7 ANTERIOR CERVICAL DECOMPRESSION/DISCECTOMY FUSION, ALLOGRAFT, PLATE (N/A )  Patient Location: PACU  Anesthesia Type:General  Level of Consciousness: awake and patient cooperative  Airway & Oxygen Therapy: Patient Spontanous Breathing  Post-op Assessment: Report given to RN and Post -op Vital signs reviewed and stable  Post vital signs: Reviewed and stable  Last Vitals:  Vitals Value Taken Time  BP 144/79 11/23/18 1515  Temp    Pulse 96 11/23/18 1516  Resp 16 11/23/18 1516  SpO2 100 % 11/23/18 1516  Vitals shown include unvalidated device data.  Last Pain:  Vitals:   11/23/18 1102  TempSrc: Tympanic  PainSc: 2       Patients Stated Pain Goal: 4 (62/83/66 2947)  Complications: No apparent anesthesia complications

## 2018-11-23 NOTE — Anesthesia Preprocedure Evaluation (Signed)
Anesthesia Evaluation  Patient identified by MRN, date of birth, ID band Patient awake    Reviewed: Allergy & Precautions, NPO status , Patient's Chart, lab work & pertinent test results  History of Anesthesia Complications Negative for: history of anesthetic complications  Airway Mallampati: II  TM Distance: >3 FB Neck ROM: Full    Dental  (+) Teeth Intact   Pulmonary neg pulmonary ROS,    breath sounds clear to auscultation       Cardiovascular negative cardio ROS   Rhythm:Regular     Neuro/Psych Seizures -,   Neuromuscular disease negative psych ROS   GI/Hepatic negative GI ROS, Neg liver ROS,   Endo/Other  negative endocrine ROS  Renal/GU negative Renal ROS     Musculoskeletal  (+) Arthritis ,   Abdominal   Peds  Hematology negative hematology ROS (+)   Anesthesia Other Findings   Reproductive/Obstetrics                             Anesthesia Physical Anesthesia Plan  ASA: II  Anesthesia Plan: General   Post-op Pain Management:    Induction: Intravenous  PONV Risk Score and Plan: 3 and Ondansetron and Dexamethasone  Airway Management Planned: Oral ETT  Additional Equipment: None  Intra-op Plan:   Post-operative Plan: Extubation in OR  Informed Consent: I have reviewed the patients History and Physical, chart, labs and discussed the procedure including the risks, benefits and alternatives for the proposed anesthesia with the patient or authorized representative who has indicated his/her understanding and acceptance.     Dental advisory given  Plan Discussed with: CRNA and Surgeon  Anesthesia Plan Comments:         Anesthesia Quick Evaluation

## 2018-11-23 NOTE — Discharge Instructions (Addendum)
°  POSTOP CERVICAL FUSION    Ok to shower 1 days postop.  Do not apply any creams or ointments to incision.  Do not remove steri-strips.  Dressing is waterproof . Use extra collar for showers.   No aggressive activity. Cervical collar must be on at all times even when showering.  Do not bend or turn neck.    No driving  No lifting, pushing , pulling.

## 2018-11-23 NOTE — Anesthesia Procedure Notes (Signed)

## 2018-11-23 NOTE — Op Note (Addendum)
Preop diagnosis: C5-6, C6-7 disc protrusion with foraminal stenosis.  Postop diagnosis: Same  Procedure: C5-6, C6-7 anterior cervical discectomy and fusion, allograft and plate.  Surgeon: Rodell Perna, MD  Assistant: Benjiman Core, PA-C medically necessary and present for the entire procedure  Anesthesia General oral tracheal +6 cc Marcaine local.  Implants: Synthes skyline 32 mm plate 12 mm screws x6.VG-2 cortical cancellus allograft 7 mm height x2.  Procedure: After induction general anesthesia preoperative Ancef prophylaxis intubation had ultra traction application without weight arms tucked at the side wrist restraints applied for visualization with fluoroscopy intermittently during the case neck was prepped with DuraPrep the area squared with towel sterile skin marker starting at the midline extending to the left in line with skin lines Betadine Steri-Drape sterile female standard the head and thyroid sheets and drapes.  Timeout procedure was completed.  Incision was made based on palpable landmarks starting at the midline extending in the left then platysma was divided.  Blunt dissection with carotid sheath and contents lateral down to the longus Coley was performed prominent spurs at C5-6 and C6-7 were noted and a short 25 needle was placed in the C5-6 level with a straight clamp confirmed the appropriate level with the laterally sterilely draped C arm spot image.  Spur was removed disc was excised marking it and C6-7 level was addressed first since patient had more severe stenosis with left radiculopathy with left foraminal stenosis at C6-7.  Anterior spurs removed.  Teeth Cloward blades placed left and right smooth blades cephalad caudad.  Discectomy was performed progressing back to the posterior longitudinal ligament use of the bur Cloward curettes operative microscope and microdissection was performed resecting the disc protrusion with full visualization of the dura.  There was overhanging  spurs removed with the 1 and 2 mm Kerrison.  Posterior longitudinal ligament was taken down removing the disc protrusion.  Dural sac was decompressed with no residual compression.  Some Surgi-Flo was used for some epidural oozing.  Uncovertebral joints were stripped and bone was removed out on the left side at the entrance to the foramina where there was stenosis.  Once this was decompressed trial sizers were 6 with a little loose and 7 mm trial sizer gave a nice fit.  Cortical cancellus allograft was marked and held with a graft holder.  As it was being tightened it popped loose the graft fell and hit the floor.  The second graft was opened marked at the midline and then inserted in the normal fashion with CRNA pulling traction countersinking 2 mm with good fit.  Anterior spurs have been removed.  Identical procedure was then repeated at the C5/6 level with more foraminal stenosis on the right side which was not symptomatic.  Midline disc protrusion was decompressed down to the dura.  7 mm graft was also selected at the site countersunk 2 mm was secure.  Trial sizers with first a 28 and a 32 finally a 32 mm plate was selected held with a single spike at the C5-6 level.  Holes were drilled 12 mm screws were placed x6 confirmed with AP lateral fluoroscopic images showing good position of plate graft and screws and then all sick screws were locked in with a tiny screwdriver.  Operative field was dry Hemovac was placed in line with the skin incision with in and out technique using the trocar in line with the skin incision.  Platysma closed with 3-0 Vicryl 4-0 Vicryl subarticular closure tincture benzoin Steri-Strips Marcaine infiltration postop dressing  soft cervical collar was then applied and patient was transferred to care room with good relief of her left radicular upper extremity symptoms.

## 2018-11-24 DIAGNOSIS — M50122 Cervical disc disorder at C5-C6 level with radiculopathy: Secondary | ICD-10-CM | POA: Diagnosis not present

## 2018-11-24 MED ORDER — ONDANSETRON HCL 4 MG PO TABS: 4.0000 mg | ORAL_TABLET | Freq: Four times a day (QID) | ORAL | 0 refills | Status: DC | PRN

## 2018-11-24 NOTE — Plan of Care (Signed)
Patient alert and oriented, mae's well, voiding adequate amount of urine, swallowing without difficulty, no c/o pain at time of discharge. Patient discharged home with family. Script and discharged instructions given to patient. Patient and family stated understanding of instructions given. Patient has an appointment with Dr. Yates  

## 2018-11-24 NOTE — Progress Notes (Signed)
   Subjective: 1 Day Post-Op Procedure(s) (LRB): c5-6, c6-7 ANTERIOR CERVICAL DECOMPRESSION/DISCECTOMY FUSION, ALLOGRAFT, PLATE (N/A) Patient reports pain as mild.  No arm pain.  Objective: Vital signs in last 24 hours: Temp:  [97.6 F (36.4 C)-98.5 F (36.9 C)] 98.2 F (36.8 C) (07/21 0719) Pulse Rate:  [75-98] 77 (07/21 0719) Resp:  [13-21] 16 (07/21 0719) BP: (108-148)/(64-80) 108/64 (07/21 0719) SpO2:  [97 %-100 %] 99 % (07/21 0719) Weight:  [86.3 kg] 86.3 kg (07/20 1102)  Intake/Output from previous day: 07/20 0701 - 07/21 0700 In: 1000 [I.V.:1000] Out: 100 [Blood:100] Intake/Output this shift: No intake/output data recorded.  No results for input(s): HGB in the last 72 hours. No results for input(s): WBC, RBC, HCT, PLT in the last 72 hours. No results for input(s): NA, K, CL, CO2, BUN, CREATININE, GLUCOSE, CALCIUM in the last 72 hours. No results for input(s): LABPT, INR in the last 72 hours.  Neurologically intact Dg Cervical Spine 2-3 Views  Result Date: 11/23/2018 CLINICAL DATA:  ACDF C5-7 EXAM: CERVICAL SPINE - 2-3 VIEW COMPARISON:  None. FINDINGS: Levels cannot be accurately determined on this study given provided images. However, an anterior plate has been affixed to the cervical spine at 3 levels, likely from C5 through C7 given history. Evaluation of the C7 level is limited. IMPRESSION: Surgical changes as above. It is difficult to determine levels with certainty. Assuming the hardware extends from C5 through C7, the C7 level is obscured by soft tissues. Electronically Signed   By: Dorise Bullion III M.D   On: 11/23/2018 15:08   Dg C-arm 1-60 Min  Result Date: 11/23/2018 CLINICAL DATA:  ACDF C5-7 EXAM: CERVICAL SPINE - 2-3 VIEW COMPARISON:  None. FINDINGS: Levels cannot be accurately determined on this study given provided images. However, an anterior plate has been affixed to the cervical spine at 3 levels, likely from C5 through C7 given history. Evaluation of  the C7 level is limited. IMPRESSION: Surgical changes as above. It is difficult to determine levels with certainty. Assuming the hardware extends from C5 through C7, the C7 level is obscured by soft tissues. Electronically Signed   By: Dorise Bullion III M.D   On: 11/23/2018 15:08    Assessment/Plan: 1 Day Post-Op Procedure(s) (LRB): c5-6, c6-7 ANTERIOR CERVICAL DECOMPRESSION/DISCECTOMY FUSION, ALLOGRAFT, PLATE (N/A) Plan:   Home . Office one week. HV pulled.   Marybelle Killings 11/24/2018, 7:48 AM

## 2018-11-24 NOTE — Progress Notes (Signed)
Occupational Therapy Evaluation Patient Details Name: Paige Dunn MRN: [id number] DOB: 1981/01/13 Today's Date: 11/24/2018    History of Present Illness c5-6, c6-7 ANTERIOR CERVICAL DECOMPRESSION/DISCECTOMY FUSION   Clinical Impression   Completed all education regarding ADL and functional mobility while adhering to back precautions. Pt was able to return demonstrate during session while getting bathed and dressed during session. Pt very appreciative. Pt issued level 2 theraputty for L hand strengthening. No further OT needed.     Follow Up Recommendations  No OT follow up    Equipment Recommendations  None recommended by OT    Recommendations for Other Services       Precautions / Restrictions Precautions Precautions: Cervical Precaution Booklet Issued: Yes (comment) Required Braces or Orthoses: Cervical Brace Cervical Brace: Soft collar;At all times(has additional soft collar to switch out)      Mobility Bed Mobility Overal bed mobility: Needs Assistance             General bed mobility comments: Educated pt on correct technique for bed mobility  Transfers Overall transfer level: Needs assistance   Transfers: Sit to/from Stand;Stand Pivot Transfers Sit to Stand: Supervision         General transfer comment: s for body mechanics. Able to return demosntrte after education    Balance Overall balance assessment: No apparent balance deficits (not formally assessed)                                         ADL either performed or assessed with clinical judgement   ADL                                         General ADL Comments: Able to complete figure 4 opsition for ADL. Educated on compensatory strategies. Pt completed ADL using strategies addressing bathing, dressing, grooming adn hygiene after toileting. Pt may benefit form use of reacher.      Vision         Perception     Praxis      Pertinent Vitals/Pain  Pain Assessment: 0-10 Pain Score: 4  Pain Location: neck Pain Descriptors / Indicators: Aching Pain Intervention(s): Limited activity within patient's tolerance     Hand Dominance Right   Extremity/Trunk Assessment Upper Extremity Assessment Upper Extremity Assessment: LUE deficits/detail LUE Deficits / Details: L hand weakness, especially intrinsics; ulnar side weaker. Pt reports numbness and weakness has improved  LUE Sensation: decreased light touch LUE Coordination: decreased fine motor   Lower Extremity Assessment Lower Extremity Assessment: Overall WFL for tasks assessed   Cervical / Trunk Assessment Cervical / Trunk Assessment: Other exceptions Cervical / Trunk Exceptions: cervical fusion   Communication Communication Communication: No difficulties   Cognition Arousal/Alertness: Awake/alert Behavior During Therapy: WFL for tasks assessed/performed Overall Cognitive Status: Within Functional Limits for tasks assessed                                     General Comments       Exercises Exercises: Other exercises Other Exercises Other Exercises: Level 2 theraputty strengthening ex for L hand for grip and pinch stregnthening; Focus on intrinsic stregthening Other Exercises: fine motor/coordination   Shoulder Instructions  Home Living Family/patient expects to be discharged to:: Private residence Living Arrangements: Spouse/significant other;Children Available Help at Discharge: Available 24 hours/day Type of Home: House Home Access: Stairs to enter Technical brewer of Steps: 10 Entrance Stairs-Rails: Right Home Layout: Two level;Bed/bath upstairs Alternate Level Stairs-Number of Steps: flight   Bathroom Shower/Tub: Corporate investment banker: Standard Bathroom Accessibility: Yes How Accessible: Accessible via walker Home Equipment: None   Additional Comments: Husband has rented a Risk analyst chair      Prior  Functioning/Environment Level of Independence: Independent                 OT Problem List: Decreased strength;Decreased knowledge of use of DME or AE;Decreased knowledge of precautions;Impaired sensation;Pain;Impaired UE functional use      OT Treatment/Interventions:      OT Goals(Current goals can be found in the care plan section) Acute Rehab OT Goals Patient Stated Goal: to go home OT Goal Formulation: All assessment and education complete, DC therapy  OT Frequency:     Barriers to D/C:            Co-evaluation              AM-PAC OT "6 Clicks" Daily Activity     Outcome Measure Help from another person eating meals?: None Help from another person taking care of personal grooming?: A Little Help from another person toileting, which includes using toliet, bedpan, or urinal?: A Little Help from another person bathing (including washing, rinsing, drying)?: A Little Help from another person to put on and taking off regular upper body clothing?: A Little Help from another person to put on and taking off regular lower body clothing?: A Little 6 Click Score: 19   End of Session Equipment Utilized During Treatment: Cervical collar Nurse Communication: Mobility status  Activity Tolerance: Patient tolerated treatment well Patient left: in bed;with call bell/phone within reach  OT Visit Diagnosis: Muscle weakness (generalized) (M62.81);Pain Pain - part of body: (neck)                Time: 2956-2130 OT Time Calculation (min): 45 min Charges:  OT General Charges $OT Visit: 1 Visit OT Evaluation $OT Eval Low Complexity: 1 Low OT Treatments $Self Care/Home Management : 23-37 mins  Maurie Boettcher, OT/L   Acute OT Clinical Specialist Acute Rehabilitation Services Pager [phone] Office [phone]    [facility] 11/24/2018, 11:09 AM

## 2018-11-26 ENCOUNTER — Encounter (HOSPITAL_COMMUNITY): Payer: Self-pay | Admitting: Orthopaedic Surgery

## 2018-12-03 NOTE — Discharge Summary (Signed)
Patient ID: Paige Dunn MRN: [id number] DOB/AGE: 07-29-1980 38 y.o.  Admit date: 11/23/2018 Discharge date: 12/03/2018  Admission Diagnoses:  Active Problems:   Protrusion of cervical intervertebral disc   Cervical stenosis of spine   Discharge Diagnoses:  Active Problems:   Protrusion of cervical intervertebral disc   Cervical stenosis of spine  status post Procedure(s): cervical 5-6, cervical 6-7 ANTERIOR CERVICAL DECOMPRESSION/DISCECTOMY FUSION, ALLOGRAFT, PLATE  Past Medical History:  Diagnosis Date  . Arthritis    in spine  . Cesarean delivery delivered   . Epilepsy ([place])   . Fibrocystic breast disease   . Seizures ([place])    started 2014 the only seizure, stopped meds in 2018    Surgeries: Procedure(s): cervical 5-6, cervical 6-7 ANTERIOR CERVICAL DECOMPRESSION/DISCECTOMY FUSION, ALLOGRAFT, PLATE on 6/56/8127   Consultants:   Discharged Condition: Improved  Hospital Course: Paige Dunn is an 38 y.o. female who was admitted 11/23/2018 for operative treatment of cervical stenosis/HNP. Patient failed conservative treatments (please see the history and physical for the specifics) and had severe unremitting pain that affects sleep, daily activities and work/hobbies. After pre-op clearance, the patient was taken to the operating room on 11/23/2018 and underwent  Procedure(s): cervical 5-6, cervical 6-7 ANTERIOR CERVICAL DECOMPRESSION/DISCECTOMY FUSION, ALLOGRAFT, PLATE.    Patient was given perioperative antibiotics:  Anti-infectives (From admission, onward)   Start     Dose/Rate Route Frequency Ordered Stop   11/23/18 2100  ceFAZolin (ANCEF) IVPB 1 g/50 mL premix     1 g 100 mL/hr over 30 Minutes Intravenous Every 8 hours 11/23/18 1617 11/24/18 0505   11/23/18 1100  ceFAZolin (ANCEF) IVPB 2g/100 mL premix     2 g 200 mL/hr over 30 Minutes Intravenous On call to O.R. 11/23/18 1047 11/23/18 1303   11/23/18 1048  ceFAZolin (ANCEF) 2-4 GM/100ML-% IVPB    Note to  Pharmacy: Lorne Skeens   : cabinet override      11/23/18 1048 11/23/18 1258       Patient was given sequential compression devices and early ambulation to prevent DVT.   Patient benefited maximally from hospital stay and there were no complications. At the time of discharge, the patient was urinating/moving their bowels without difficulty, tolerating a regular diet, pain is controlled with oral pain medications and they have been cleared by PT/OT.   Recent vital signs: No data found.   Recent laboratory studies: No results for input(s): WBC, HGB, HCT, PLT, NA, K, CL, CO2, BUN, CREATININE, GLUCOSE, INR, CALCIUM in the last 72 hours.  Invalid input(s): PT, 2   Discharge Medications:   Allergies as of 11/24/2018      Reactions   Aspirin Nausea And Vomiting, Other (See Comments)   Tremors, dizziness, upset stomach   Lamictal [lamotrigine]    Neuromuscular discomfort      Medication List    STOP taking these medications   ketoconazole 2 % shampoo Commonly known as: NIZORAL   traMADol 50 MG tablet Commonly known as: ULTRAM     TAKE these medications   gabapentin 300 MG capsule Commonly known as: NEURONTIN Take 300 mg by mouth at bedtime.   ondansetron 4 MG disintegrating tablet Commonly known as: Zofran ODT Take 1 tablet (4 mg total) by mouth every 8 (eight) hours as needed for nausea or vomiting.   ondansetron 4 MG tablet Commonly known as: ZOFRAN Take 1 tablet (4 mg total) by mouth every 6 (six) hours as needed for nausea or vomiting.   oxyCODONE-acetaminophen 5-325  MG tablet Commonly known as: PERCOCET/ROXICET Take 1-2 tablets by mouth every 6 (six) hours as needed for severe pain.   tiZANidine 4 MG capsule Commonly known as: ZANAFLEX TAKE 1 CAPSULE (4 MG TOTAL) BY MOUTH 2 (TWO) TIMES DAILY AS NEEDED FOR MUSCLE SPASMS.       Diagnostic Studies: Dg Cervical Spine 2-3 Views  Result Date: 11/23/2018 CLINICAL DATA:  ACDF C5-7 EXAM: CERVICAL SPINE - 2-3 VIEW  COMPARISON:  None. FINDINGS: Levels cannot be accurately determined on this study given provided images. However, an anterior plate has been affixed to the cervical spine at 3 levels, likely from C5 through C7 given history. Evaluation of the C7 level is limited. IMPRESSION: Surgical changes as above. It is difficult to determine levels with certainty. Assuming the hardware extends from C5 through C7, the C7 level is obscured by soft tissues. Electronically Signed   By: Dorise Bullion III M.D   On: 11/23/2018 15:08   Dg C-arm 1-60 Min  Result Date: 11/23/2018 CLINICAL DATA:  ACDF C5-7 EXAM: CERVICAL SPINE - 2-3 VIEW COMPARISON:  None. FINDINGS: Levels cannot be accurately determined on this study given provided images. However, an anterior plate has been affixed to the cervical spine at 3 levels, likely from C5 through C7 given history. Evaluation of the C7 level is limited. IMPRESSION: Surgical changes as above. It is difficult to determine levels with certainty. Assuming the hardware extends from C5 through C7, the C7 level is obscured by soft tissues. Electronically Signed   By: Dorise Bullion III M.D   On: 11/23/2018 15:08   Mm Diag Breast Tom[place] Uni Left  Result Date: 11/19/2018 CLINICAL DATA:  38 year old patient recalled from recent screening mammogram for evaluation of possible asymmetry in the left breast seen posteriorly and laterally in the CC projection. EXAM: DIGITAL DIAGNOSTIC UNILATERAL LEFT MAMMOGRAM WITH CAD AND TOMO COMPARISON:  11/16/2018 and earlier priors ACR Breast Density Category d: The breast tissue is extremely dense, which lowers the sensitivity of mammography. FINDINGS: Spot compression views of the posterolateral left breast in the CC projection show dispersion of fibroglandular tissue with no persistent asymmetry, mass, or distortion. A 90 degree lateral view of the left breast is negative. Mammographic images were processed with CAD. IMPRESSION: No evidence of malignancy in the  left breast. RECOMMENDATION: Screening mammogram in one year.(Code:SM-B-01Y) I have discussed the findings and recommendations with the patient. Results were also provided in writing at the conclusion of the visit. If applicable, a reminder letter will be sent to the patient regarding the next appointment. BI-RADS CATEGORY  1: Negative. Electronically Signed   By: Curlene Dolphin M.D.   On: 11/19/2018 11:24   Mm 3d Screen Breast Bilateral  Result Date: 11/17/2018 CLINICAL DATA:  Screening. EXAM: DIGITAL SCREENING BILATERAL MAMMOGRAM WITH TOMO AND CAD COMPARISON:  Previous exam(s). ACR Breast Density Category d: The breast tissue is extremely dense, which lowers the sensitivity of mammography. FINDINGS: In the left breast, a possible asymmetry warrants further evaluation. In the right breast, no findings suspicious for malignancy. Images were processed with CAD. IMPRESSION: Further evaluation is suggested for possible asymmetry in the left breast. RECOMMENDATION: Diagnostic mammogram and possibly ultrasound of the left breast. (Code:[id number]) The patient will be contacted regarding the findings, and additional imaging will be scheduled. BI-RADS CATEGORY  0: Incomplete. Need additional imaging evaluation and/or prior mammograms for comparison. Electronically Signed   By: Margarette Canada M.D.   On: 11/17/2018 07:45      Follow-up Information  Marybelle Killings, MD. Schedule an appointment as soon as possible for a visit in 1 week(s).   Specialty: Orthopedic Surgery Why: need return office visit one week.  Contact information: [place] [address] [phone]           Discharge Plan:  discharge to home  Disposition:     Signed: Benjiman Core  12/03/2018, 8:50 AM

## 2018-12-04 ENCOUNTER — Encounter: Payer: Self-pay | Admitting: Surgery

## 2018-12-04 ENCOUNTER — Inpatient Hospital Stay: Payer: BC Managed Care – PPO | Admitting: Orthopaedic Surgery

## 2018-12-04 ENCOUNTER — Ambulatory Visit: Payer: Self-pay

## 2018-12-04 ENCOUNTER — Other Ambulatory Visit: Payer: Self-pay

## 2018-12-04 ENCOUNTER — Ambulatory Visit (INDEPENDENT_AMBULATORY_CARE_PROVIDER_SITE_OTHER): Payer: BC Managed Care – PPO | Admitting: Surgery

## 2018-12-04 DIAGNOSIS — M5020 Other cervical disc displacement, unspecified cervical region: Secondary | ICD-10-CM

## 2018-12-04 NOTE — Progress Notes (Signed)
38 year old white female who is 1 week status post cervical fusion returns.  States that she is doing well.  Preop arm pain is gone.  She is very pleased over this point.   Exam Surgical incision looks good.  Wound healing nicely without signs of infection.  New Steri-Strips applied.  Neurologically intact.   Plan Patient will follow-up in the office in 5 weeks for recheck.  Anticipate out of work at least 6 to 8 weeks postop.  No driving.  Must keep collar on at all times.  All questions answered.

## 2018-12-07 LAB — COVID-19 CARE EVERYWHERE: COVID-19 CARE EVERYWHERE: NEGATIVE TEXT — NL

## 2019-01-06 ENCOUNTER — Telehealth: Payer: Self-pay | Admitting: Orthopaedic Surgery

## 2019-01-06 NOTE — Telephone Encounter (Signed)
Mailed medical records release per patient request

## 2019-01-07 ENCOUNTER — Telehealth: Payer: Self-pay | Admitting: Orthopaedic Surgery

## 2019-01-07 NOTE — Telephone Encounter (Signed)
Patient called wanting to know if there was something Dr. Lorin Mercy can prescribe for her keloid scar.  CB#[phone].  Thank you.

## 2019-01-07 NOTE — Telephone Encounter (Signed)
Please advise thanks.

## 2019-01-07 NOTE — Telephone Encounter (Signed)
I called discussed has appt next week. merderma oil is what she is using now.

## 2019-01-12 ENCOUNTER — Ambulatory Visit: Payer: BC Managed Care – PPO | Admitting: Orthopaedic Surgery

## 2019-01-13 ENCOUNTER — Ambulatory Visit (INDEPENDENT_AMBULATORY_CARE_PROVIDER_SITE_OTHER): Payer: BC Managed Care – PPO

## 2019-01-13 ENCOUNTER — Encounter: Payer: Self-pay | Admitting: Orthopaedic Surgery

## 2019-01-13 ENCOUNTER — Ambulatory Visit (INDEPENDENT_AMBULATORY_CARE_PROVIDER_SITE_OTHER): Payer: BC Managed Care – PPO | Admitting: Orthopaedic Surgery

## 2019-01-13 ENCOUNTER — Other Ambulatory Visit: Payer: Self-pay

## 2019-01-13 VITALS — BP 108/75 | HR 70 | Ht 69.0 in | Wt 190.0 lb

## 2019-01-13 DIAGNOSIS — Z981 Arthrodesis status: Secondary | ICD-10-CM | POA: Insufficient documentation

## 2019-01-13 NOTE — Progress Notes (Signed)
   Post-Op Visit Note   Patient: Paige Dunn           Date of Birth: Sep 05, 1980           MRN: [id number] Visit Date: 01/13/2019 PCP: Scot Jun, FNP   Assessment & Plan: Follow-up cervical fusion she has some keloid formation on her neck which had rated moderate and slightly raised she has some keloid formation at other areas where she has had previous scars.  She is gotten good relief of her arm pain still has some soreness in her neck and x-rays demonstrate no motion on flexion-extension and some partial incorporation of the graft.  I discussed with her that this should continue to improve as the graft continues to incorporate.  I will check her back again in 2 months.  Chief Complaint:  Chief Complaint  Patient presents with  . Neck - Follow-up    11/23/2018 C5-6, C6-7 ACDF, Allograft, Plate   Visit Diagnoses:  1. Status post cervical spinal fusion   2. S/P cervical spinal fusion     Plan: Discontinue collar.  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  She still has some problems sleeping at night and is been using one half oxycodone. Return 2 months. She can try neurontin at night if needed and stop oxycodone. RTW for 01/25/19.  Follow-Up Instructions: ROV 2 months   Orders:  Orders Placed This Encounter  Procedures  . XR Cervical Spine 2 or 3 views   No orders of the defined types were placed in this encounter.   Imaging: Xr Cervical Spine 2 Or 3 Views  Result Date: 01/13/2019 Lateral flexion-extension C-spine x-rays obtained and reviewed.  This shows C5-6, C6-7 2 level cervical fusion without motion on flexion-extension views. Impression: Partial graft incorporation and no motion on flexion-extension x-rays C5-6, C6-7 fusion.   PMFS History: Patient Active Problem List   Diagnosis Date Noted  . S/P cervical spinal fusion 01/13/2019  . Sciatica of left side 05/01/2018  . 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 Arthritis    in spine  . Cesarean delivery delivered   . Epilepsy ([place])   . Fibrocystic breast disease   . Seizures ([place])    started 2014 the only seizure, stopped meds in 2018    Family History  Problem Relation Age of Onset  . Heart attack Father   . Hypertension Maternal Grandmother     Past Surgical History:  Procedure Laterality Date  . ANTERIOR CERVICAL DECOMP/DISCECTOMY FUSION N/A 11/23/2018   Procedure: cervical 5-6, cervical 6-7 ANTERIOR CERVICAL DECOMPRESSION/DISCECTOMY FUSION, ALLOGRAFT, PLATE;  Surgeon: Marybelle Killings, MD;  Location: [facility];  Service: Orthopedics;  Laterality: N/A;  . CERVICAL ABLATION  2015  . CESAREAN SECTION    . DILATION AND CURETTAGE OF UTERUS    . DILATION AND EVACUATION  08/16/2011   Procedure: DILATATION AND EVACUATION;  Surgeon: Alwyn Pea, MD;  Location: [facility];  Service: Gynecology;  Laterality: N/A;  . HYSTEROSCOPY  2004  . LAPAROSCOPY  2004   Social History   Occupational History  . Not on file  Tobacco Use  . Smoking status: Never Smoker  . Smokeless tobacco: Never Used  Substance and Sexual Activity  . Alcohol use: No  . Drug use: No  . Sexual activity: Yes    Partners: Male

## 2019-01-25 ENCOUNTER — Telehealth (HOSPITAL_BASED_OUTPATIENT_CLINIC_OR_DEPARTMENT_OTHER): Payer: Self-pay | Admitting: Family Medicine

## 2019-01-25 DIAGNOSIS — Z20822 Contact with and (suspected) exposure to covid-19: Secondary | ICD-10-CM

## 2019-01-25 DIAGNOSIS — Z20828 Contact with and (suspected) exposure to other viral communicable diseases: Secondary | ICD-10-CM

## 2019-01-25 NOTE — Progress Notes (Signed)
covid 19 test

## 2019-01-29 ENCOUNTER — Ambulatory Visit
Admission: RE | Admit: 2019-01-29 | Discharge: 2019-01-29 | Disposition: A | Discharge status: Home / Self Care | Payer: 344 | Attending: Family Medicine | Admitting: Family Medicine

## 2019-01-29 DIAGNOSIS — Z20828 Contact with and (suspected) exposure to other viral communicable diseases: Secondary | ICD-10-CM | POA: Diagnosis present

## 2019-01-29 DIAGNOSIS — Z20822 Contact with and (suspected) exposure to covid-19: Secondary | ICD-10-CM

## 2019-01-29 LAB — COVID-19 OUTPATIENT: COVID-19 OUTPATIENT: NEGATIVE

## 2019-02-03 ENCOUNTER — Telehealth (HOSPITAL_BASED_OUTPATIENT_CLINIC_OR_DEPARTMENT_OTHER): Payer: Self-pay | Admitting: Family Medicine

## 2019-02-03 DIAGNOSIS — Z20828 Contact with and (suspected) exposure to other viral communicable diseases: Secondary | ICD-10-CM

## 2019-02-03 DIAGNOSIS — Z20822 Contact with and (suspected) exposure to covid-19: Secondary | ICD-10-CM

## 2019-02-03 NOTE — Progress Notes (Signed)
COVID-19     Review of Systems  Physical Exam

## 2019-03-16 ENCOUNTER — Ambulatory Visit: Payer: BC Managed Care – PPO | Admitting: Orthopaedic Surgery

## 2019-04-28 ENCOUNTER — Ambulatory Visit: Payer: BC Managed Care – PPO | Admitting: Orthopaedic Surgery

## 2019-05-12 ENCOUNTER — Ambulatory Visit (INDEPENDENT_AMBULATORY_CARE_PROVIDER_SITE_OTHER): Payer: BC Managed Care – PPO | Admitting: Orthopaedic Surgery

## 2019-05-12 ENCOUNTER — Ambulatory Visit: Payer: Self-pay

## 2019-05-12 ENCOUNTER — Other Ambulatory Visit: Payer: Self-pay

## 2019-05-12 ENCOUNTER — Encounter: Payer: Self-pay | Admitting: Orthopaedic Surgery

## 2019-05-12 VITALS — Ht 70.5 in | Wt 184.0 lb

## 2019-05-12 DIAGNOSIS — Z981 Arthrodesis status: Secondary | ICD-10-CM

## 2019-05-12 NOTE — Progress Notes (Signed)
Office Visit Note   Patient: Paige Dunn           Date of Birth: 1980-11-02           MRN: [id number] Visit Date: 05/12/2019              Requested by: Scot Jun, [address] [place],   [id number] PCP: Scot Jun, FNP   Assessment & Plan: Visit Diagnoses:  1. Status post cervical spinal fusion     Plan: Cervical fusion with keloid formation.  We will refer her to see Dr.Bowers about her keloid on her neck.  She states of the scar was not quite as dark it really would be as big a problem.  She is happy with the relief of her neck and arm pain from the surgery.  Follow-Up Instructions: Return if symptoms worsen or fail to improve.   Orders:  Orders Placed This Encounter  Procedures  . XR Cervical Spine 2 or 3 views   No orders of the defined types were placed in this encounter.     Procedures: No procedures performed   Clinical Data: No additional findings.   Subjective: Chief Complaint  Patient presents with  . Neck - Follow-up    11/23/2018 C5-6, C6-7 ACDF, Allograft, Plate    HPI 39-year-old female returns post two-level cervical fusion C5-6 C6-7 done on 11/23/2018.  Patient states her arm is doing well.  She is occasionally had uncomfortable feelings in left side of her neck.  She had keloid formation of her subcuticular neck incision.  Previous C-section with keloid formation as well.  Review of Systems reviewed updated unchanged.  History of seizure disorder and cervical fusion C5-C7.   Objective: Vital Signs: Ht 5' 10.5" (1.791 m)   Wt 184 lb (83.5 kg)   BMI 26.03 kg/m   Physical Exam Constitutional:      Appearance: She is well-developed.  HENT:     Head: Normocephalic.     Right Ear: External ear normal.     Left Ear: External ear normal.  Eyes:     Pupils: Pupils are equal, round, and reactive to light.  Neck:     Thyroid: No thyromegaly.     Trachea: No tracheal deviation.  Cardiovascular:     Rate and  Rhythm: Normal rate.  Pulmonary:     Effort: Pulmonary effort is normal.  Abdominal:     Palpations: Abdomen is soft.  Skin:    General: Skin is warm and dry.  Neurological:     Mental Status: She is alert and oriented to person, place, and time.  Psychiatric:        Behavior: Behavior normal.     Ortho Exam no brachial plexus tenderness upper extremity reflexes are 2+ and symmetrical.  Keloid formation transverse anterior cervical incision.  Specialty Comments:  No specialty comments available.  Imaging: No results found.   PMFS History: Patient Active Problem List   Diagnosis Date Noted  . S/P cervical spinal fusion 01/13/2019  . Sciatica of left side 05/01/2018  . 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 Arthritis    in spine  . Cesarean delivery delivered   . Epilepsy ([place])   . Fibrocystic breast disease   . Seizures ([place])    started 2014 the only seizure, stopped meds in 2018    Family History  Problem Relation Age of Onset  . Heart  attack Father   . Hypertension Maternal Grandmother     Past Surgical History:  Procedure Laterality Date  . ANTERIOR CERVICAL DECOMP/DISCECTOMY FUSION N/A 11/23/2018   Procedure: cervical 5-6, cervical 6-7 ANTERIOR CERVICAL DECOMPRESSION/DISCECTOMY FUSION, ALLOGRAFT, PLATE;  Surgeon: Marybelle Killings, MD;  Location: [facility];  Service: Orthopedics;  Laterality: N/A;  . CERVICAL ABLATION  2015  . CESAREAN SECTION    . DILATION AND CURETTAGE OF UTERUS    . DILATION AND EVACUATION  08/16/2011   Procedure: DILATATION AND EVACUATION;  Surgeon: Alwyn Pea, MD;  Location: [facility];  Service: Gynecology;  Laterality: N/A;  . HYSTEROSCOPY  2004  . LAPAROSCOPY  2004   Social History   Occupational History  . Not on file  Tobacco Use  . Smoking status: Never Smoker  . Smokeless tobacco: Never Used  Substance and Sexual Activity  . Alcohol use: No  . Drug use: No  . Sexual  activity: Yes    Partners: Male

## 2019-09-22 ENCOUNTER — Other Ambulatory Visit: Payer: Self-pay

## 2019-09-22 ENCOUNTER — Encounter: Payer: Self-pay | Admitting: Orthopaedic Surgery

## 2019-09-22 ENCOUNTER — Ambulatory Visit (INDEPENDENT_AMBULATORY_CARE_PROVIDER_SITE_OTHER): Payer: BC Managed Care – PPO

## 2019-09-22 ENCOUNTER — Ambulatory Visit (INDEPENDENT_AMBULATORY_CARE_PROVIDER_SITE_OTHER): Payer: BC Managed Care – PPO | Admitting: Orthopaedic Surgery

## 2019-09-22 VITALS — Ht 69.0 in | Wt 189.0 lb

## 2019-09-22 DIAGNOSIS — Z981 Arthrodesis status: Secondary | ICD-10-CM

## 2019-09-22 NOTE — Progress Notes (Signed)
Office Visit Note   Patient: Paige Dunn           Date of Birth: 1980-10-20           MRN: [id number] Visit Date: 09/22/2019              Requested by: Scot Jun, [place] [place],  [place] [address] PCP: Scot Jun, FNP   Assessment & Plan: Visit Diagnoses:  1. Status post cervical spinal fusion   2. S/P cervical spinal fusion     Plan: We reviewed patient's previous MRI scan.  2 levels with spondylosis involvement or fused in her satisfactory imaging.  She does not have absolute complete incorporation of the grafts but there is no graft resorption and both grafts appear to be progressively consolidating.  It could be that 1 level is not completely consolidated giving her some mild symptoms.  We discussed a walking program.     I will check her back again in 2 months if she has persistent symptoms we can consider imaging studies versus therapy referral.  At this point she states she like to defer therapy. Follow-Up Instructions: Return in about 2 months (around 11/22/2019).   Orders:  Orders Placed This Encounter  Procedures  . XR Cervical Spine 2 or 3 views   No orders of the defined types were placed in this encounter.     Procedures: No procedures performed   Clinical Data: No additional findings.   Subjective: Chief Complaint  Patient presents with  . Neck - Pain    HPI 39 year old female returns post two-level cervical fusion C5-6 C6-7 with only 2 level cervical disease surgery was 11/23/2018.  Patient states recently she started having some discomfort in her neck and her left shoulder rating down to her left elbow similar to what she had before surgery.  She has some difficulty getting comfortable.  She has also noted some pulling in her left knee when she turns her body or her head.  No headaches.  She did have 1 or 2 occasions where she had trace tingling in her left arm into her finger but it did not last very long.  No association  with past seizure disorder.  Review of Systems 14 point systems noncontributory to HPI.   Objective: Vital Signs: Ht 5\' 9"  (1.753 m)   Wt 189 lb (85.7 kg)   BMI 27.91 kg/m   Physical Exam Constitutional:      Appearance: She is well-developed.  HENT:     Head: Normocephalic.     Right Ear: External ear normal.     Left Ear: External ear normal.  Eyes:     Pupils: Pupils are equal, round, and reactive to light.  Neck:     Thyroid: No thyromegaly.     Trachea: No tracheal deviation.  Cardiovascular:     Rate and Rhythm: Normal rate.  Pulmonary:     Effort: Pulmonary effort is normal.  Abdominal:     Palpations: Abdomen is soft.  Skin:    General: Skin is warm and dry.  Neurological:     Mental Status: She is alert and oriented to person, place, and time.  Psychiatric:        Behavior: Behavior normal.     Ortho Exam patient has some keloid formation at her old anterior cervical incision which is closed subcuticular closure.  Negative Spurling mild brachial plexus tenderness.  Negative Lhermitte.  Upper extremity reflexes are 2+ and  symmetric ulnar nerve cubital tunnel is normal.  No lower extremity clonus no hyperreflexia.  Specialty Comments:  No specialty comments available.  Imaging: No results found.   PMFS History: Patient Active Problem List   Diagnosis Date Noted  . S/P cervical spinal fusion 01/13/2019  . Sciatica of left side 05/01/2018  . 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 Arthritis    in spine  . Cesarean delivery delivered   . Epilepsy ([place])   . Fibrocystic breast disease   . Seizures ([place])    started 2014 the only seizure, stopped meds in 2018    Family History  Problem Relation Age of Onset  . Heart attack Father   . Hypertension Maternal Grandmother     Past Surgical History:  Procedure Laterality Date  . ANTERIOR CERVICAL DECOMP/DISCECTOMY FUSION N/A 11/23/2018    Procedure: cervical 5-6, cervical 6-7 ANTERIOR CERVICAL DECOMPRESSION/DISCECTOMY FUSION, ALLOGRAFT, PLATE;  Surgeon: Marybelle Killings, MD;  Location: [facility];  Service: Orthopedics;  Laterality: N/A;  . CERVICAL ABLATION  2015  . CESAREAN SECTION    . DILATION AND CURETTAGE OF UTERUS    . DILATION AND EVACUATION  08/16/2011   Procedure: DILATATION AND EVACUATION;  Surgeon: Alwyn Pea, MD;  Location: [facility];  Service: Gynecology;  Laterality: N/A;  . HYSTEROSCOPY  2004  . LAPAROSCOPY  2004   Social History   Occupational History  . Not on file  Tobacco Use  . Smoking status: Never Smoker  . Smokeless tobacco: Never Used  Substance and Sexual Activity  . Alcohol use: No  . Drug use: No  . Sexual activity: Yes    Partners: Male

## 2019-11-25 ENCOUNTER — Ambulatory Visit: Payer: BC Managed Care – PPO | Admitting: Internal Medicine

## 2020-08-21 DIAGNOSIS — G43829 Menstrual migraine, not intractable, without status migrainosus: Secondary | ICD-10-CM

## 2020-08-21 HISTORY — DX: Menstrual migraine, not intractable, without status migrainosus: G43.829

## 2020-09-28 ENCOUNTER — Ambulatory Visit (INDEPENDENT_AMBULATORY_CARE_PROVIDER_SITE_OTHER): Payer: BC Managed Care – PPO | Admitting: Surgery

## 2020-09-28 ENCOUNTER — Encounter: Payer: Self-pay | Admitting: Surgery

## 2020-09-28 ENCOUNTER — Other Ambulatory Visit: Payer: Self-pay

## 2020-09-28 ENCOUNTER — Ambulatory Visit: Payer: Self-pay

## 2020-09-28 DIAGNOSIS — M5412 Radiculopathy, cervical region: Secondary | ICD-10-CM | POA: Diagnosis not present

## 2020-09-28 DIAGNOSIS — Z981 Arthrodesis status: Secondary | ICD-10-CM

## 2020-09-28 MED ORDER — METHYLPREDNISOLONE 4 MG PO TABS: 4.0000 mg | ORAL_TABLET | Freq: Every day | ORAL | 0 refills | Status: DC

## 2020-09-28 MED ORDER — TRAMADOL HCL 50 MG PO TABS: 50.0000 mg | ORAL_TABLET | Freq: Two times a day (BID) | ORAL | 0 refills | Status: DC | PRN

## 2020-09-28 MED ORDER — METHOCARBAMOL 500 MG PO TABS: 500.0000 mg | ORAL_TABLET | Freq: Three times a day (TID) | ORAL | 0 refills | Status: DC | PRN

## 2020-09-28 NOTE — Progress Notes (Signed)
Office Visit Note   Patient: Paige Dunn           Date of Birth: Sep 17, 1980           MRN: [id number] Visit Date: 09/28/2020              Requested by: Scot Jun, [place],  [place] [address] PCP: Scot Jun, FNP   Assessment & Plan: Visit Diagnoses:  1. Status post cervical spinal fusion   2. Radiculopathy, cervical region     Plan: At this point I will attempt conservative treatment.  Sent in prescriptions for Medrol Dosepak 6-day taper, Robaxin and Ultram.  Follow-up in 3 weeks for recheck with Dr. Lorin Mercy.  If she does not have any improvement we will likely need cervical MRI.  All questions answered.  Follow-Up Instructions: Return in about 3 weeks (around 10/19/2020) for with dr yates for recheck cervical.   Orders:  Orders Placed This Encounter  Procedures  . XR Cervical Spine 2 or 3 views   Meds ordered this encounter  Medications  . methylPREDNISolone (MEDROL) 4 MG tablet    Sig: Take 1 tablet (4 mg total) by mouth daily.    Dispense:  21 tablet    Refill:  0  . methocarbamol (ROBAXIN) 500 MG tablet    Sig: Take 1 tablet (500 mg total) by mouth every 8 (eight) hours as needed for muscle spasms.    Dispense:  60 tablet    Refill:  0  . traMADol (ULTRAM) 50 MG tablet    Sig: Take 1 tablet (50 mg total) by mouth every 12 (twelve) hours as needed.    Dispense:  30 tablet    Refill:  0      Procedures: No procedures performed   Clinical Data: No additional findings.   Subjective: Chief Complaint  Patient presents with  . Left Shoulder - Pain, Follow-up    HPI 40 year old black female presents today with complaints of neck pain and left arm pain with some feeling of weakness.  States that this problem has been off and on x2 and half months but worse the last 3 weeks.  She has left greater than right upper extremity radiculopathy with pain also going into the bilateral trapezius muscles.  She is status post C6-7  ACDF by Dr. Lorin Mercy November 23, 2018.  She admits to having some preop left arm weakness.  Some numbness and tingling and pain in both arms.  She says that standing makes it better but hurts to move her head.  She had been doing well up until onset a couple months ago. Review of Systems No current cardiac pulmonary GI GU  Objective: Vital Signs: There were no vitals taken for this visit.  Physical Exam HENT:     Head: Normocephalic and atraumatic.  Eyes:     Extraocular Movements: Extraocular movements intact.  Pulmonary:     Effort: No respiratory distress.  Musculoskeletal:     Comments: Gait is normal.  Mild brachial plexus tenderness.  Positive left trapezius tenderness.  Positive left Spurling test.  Bilateral shoulder exam unremarkable.  She does have trace left triceps weakness.  Neurological:     Mental Status: She is alert and oriented to person, place, and time.  Psychiatric:        Mood and Affect: Mood normal.     Ortho Exam  Specialty Comments:  No specialty comments available.  Imaging: No results found.  PMFS History: Patient Active Problem List   Diagnosis Date Noted  . S/P cervical spinal fusion 01/13/2019  . Sciatica of left side 05/01/2018  . 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 Arthritis    in spine  . Cesarean delivery delivered   . Epilepsy ([place])   . Fibrocystic breast disease   . Seizures ([place])    started 2014 the only seizure, stopped meds in 2018    Family History  Problem Relation Age of Onset  . Heart attack Father   . Hypertension Maternal Grandmother     Past Surgical History:  Procedure Laterality Date  . ANTERIOR CERVICAL DECOMP/DISCECTOMY FUSION N/A 11/23/2018   Procedure: cervical 5-6, cervical 6-7 ANTERIOR CERVICAL DECOMPRESSION/DISCECTOMY FUSION, ALLOGRAFT, PLATE;  Surgeon: Marybelle Killings, MD;  Location: [facility];  Service: Orthopedics;  Laterality: N/A;  . CERVICAL  ABLATION  2015  . CESAREAN SECTION    . DILATION AND CURETTAGE OF UTERUS    . DILATION AND EVACUATION  08/16/2011   Procedure: DILATATION AND EVACUATION;  Surgeon: Alwyn Pea, MD;  Location: [facility];  Service: Gynecology;  Laterality: N/A;  . HYSTEROSCOPY  2004  . LAPAROSCOPY  2004   Social History   Occupational History  . Not on file  Tobacco Use  . Smoking status: Never Smoker  . Smokeless tobacco: Never Used  Substance and Sexual Activity  . Alcohol use: No  . Drug use: No  . Sexual activity: Yes    Partners: Male

## 2020-10-09 ENCOUNTER — Encounter: Payer: Self-pay | Admitting: Surgery

## 2020-10-18 ENCOUNTER — Ambulatory Visit: Payer: BC Managed Care – PPO | Admitting: Orthopaedic Surgery

## 2020-10-20 ENCOUNTER — Other Ambulatory Visit: Payer: Self-pay | Admitting: Obstetrics and Gynecology

## 2020-10-21 ENCOUNTER — Other Ambulatory Visit: Payer: Self-pay | Admitting: Surgery

## 2021-01-27 ENCOUNTER — Telehealth: Payer: BC Managed Care – PPO | Admitting: Nurse Practitioner

## 2021-01-27 DIAGNOSIS — M545 Low back pain, unspecified: Secondary | ICD-10-CM | POA: Diagnosis not present

## 2021-01-27 MED ORDER — CYCLOBENZAPRINE HCL 10 MG PO TABS: 10.0000 mg | ORAL_TABLET | Freq: Three times a day (TID) | ORAL | 1 refills | Status: DC | PRN

## 2021-01-27 MED ORDER — PREDNISONE 20 MG PO TABS: 40.0000 mg | ORAL_TABLET | Freq: Every day | ORAL | 0 refills | Status: AC

## 2021-01-27 NOTE — Progress Notes (Signed)
Virtual Visit Consent   Paige Dunn, you are scheduled for a virtual visit with Paige Hassell Done, [place], a [facility] provider, today.     Just as with appointments in the office, your consent must be obtained to participate.  Your consent will be active for this visit and any virtual visit you may have with one of our providers in the next 365 days.     If you have a MyChart account, a copy of this consent can be sent to you electronically.  All virtual visits are billed to your insurance company just like a traditional visit in the office.    As this is a virtual visit, video technology does not allow for your provider to perform a traditional examination.  This may limit your provider's ability to fully assess your condition.  If your provider identifies any concerns that need to be evaluated in person or the need to arrange testing (such as labs, EKG, etc.), we will make arrangements to do so.     Although advances in technology are sophisticated, we cannot ensure that it will always work on either your end or our end.  If the connection with a video visit is poor, the visit may have to be switched to a telephone visit.  With either a video or telephone visit, we are not always able to ensure that we have a secure connection.     I need to obtain your verbal consent now.   Are you willing to proceed with your visit today? YES   Paige Dunn has provided verbal consent on 01/27/2021 for a virtual visit (video or telephone).   Paige Hassell Done, FNP   Date: 01/27/2021 10:00 AM   Virtual Visit via Video Note   I, Paige Dunn, connected with Paige Dunn ([id number], 02-Oct-1980) on 01/27/21 at 10:00 AM EDT by a video-enabled telemedicine application and verified that I am speaking with the correct person using two identifiers.  Location: Patient: Virtual Visit Location Patient: Home Provider: Virtual Visit Location Provider: Mobile   I discussed the limitations of  evaluation and management by telemedicine and the availability of in person appointments. The patient expressed understanding and agreed to proceed.    History of Present Illness: Paige Dunn is a 40 y.o. who identifies as a female who was assigned female at birth, and is being seen today for back pain.  HPI: Patietnt does video c/o lower back pain that started last week she denies injury. Pain remains in mid to right back pain. Pain is constant and has worsened over the last several days. Rates pain 4/10 currently. Any movement or change in position increases to 8/10. She has tried buprofen and bio freeze which has not helped. Standing helps some. Ain doe snot radiate into hip.   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[place]) 12/09/2012    Allergies:  Allergies  Allergen Reactions   Aspirin Nausea And Vomiting and Other (See Comments)    Tremors, dizziness, upset stomach   Lamictal [Lamotrigine]     Neuromuscular discomfort   Medications:  Current Outpatient Medications:    estradiol (VIVELLE-DOT) 0.1 MG/24HR patch, estradiol 0.1 mg/24 hr semiweekly transdermal patch  APPLY 1 PATCH(ES) TWICE A WEEK BY TRANSDERMAL ROUTE AS DIRECTED FOR 4 DAYS., Disp: , Rfl:    gabapentin (NEURONTIN) 300 MG capsule, Take  300 mg by mouth at bedtime., Disp: , Rfl:    methocarbamol (ROBAXIN) 500 MG tablet, Take 1 tablet (500 mg total) by mouth every 8 (eight) hours as needed for muscle spasms., Disp: 60 tablet, Rfl: 0   methylPREDNISolone (MEDROL) 4 MG tablet, Take 1 tablet (4 mg total) by mouth daily., Disp: 21 tablet, Rfl: 0   traMADol (ULTRAM) 50 MG tablet, Take 1 tablet (50 mg total) by mouth every 12 (twelve) hours as needed., Disp: 30 tablet, Rfl: 0  Observations/Objective: Patient is well-developed, well-nourished in no acute distress.  Resting comfortably  at  home.  Head is normocephalic, atraumatic.  No labored breathing.  Speech is clear and coherent with logical content.  Patient is alert and oriented at baseline.  Decrease ROM with pain in lower back with flexion and extension. (+) SLR on right at 45 degrees.  Assessment and Plan:  Paige Dunn in today with chief complaint of Back Pain   1. Acute right-sided low back pain without sciatica No bending or stooping Moist heat Rest Meds ordered this encounter  Medications   predniSONE (DELTASONE) 20 MG tablet    Sig: Take 2 tablets (40 mg total) by mouth daily with breakfast for 5 days. 2 po daily for 5 days    Dispense:  10 tablet    Refill:  0    Order Specific Question:   Supervising Provider    Answer:   Sabra Heck, BRIAN [[id number]]   cyclobenzaprine (FLEXERIL) 10 MG tablet    Sig: Take 1 tablet (10 mg total) by mouth 3 (three) times daily as needed for muscle spasms.    Dispense:  30 tablet    Refill:  1    Order Specific Question:   Supervising Provider    Answer:   Noemi Chapel [[id number]]   Sedation precautions with flexerirl.      Follow Up Instructions: I discussed the assessment and treatment plan with the patient. The patient was provided an opportunity to ask questions and all were answered. The patient agreed with the plan and demonstrated an understanding of the instructions.  A copy of instructions were sent to the patient via MyChart.  The patient was advised to call back or seek an in-person evaluation if the symptoms worsen or if the condition fails to improve as anticipated.  Time:  I spent 10 minutes with the patient via telehealth technology discussing the above problems/concerns.    Paige Hassell Done, FNP

## 2021-03-16 IMAGING — DX PORTABLE CHEST - 1 VIEW
1 series · 1 of 1 positions shown · non-contrast
Comparison: PA lateral chest 06/04/2018.

CLINICAL DATA: Left shoulder and chest pain.

EXAM:
PORTABLE CHEST 1 VIEW

[chest]
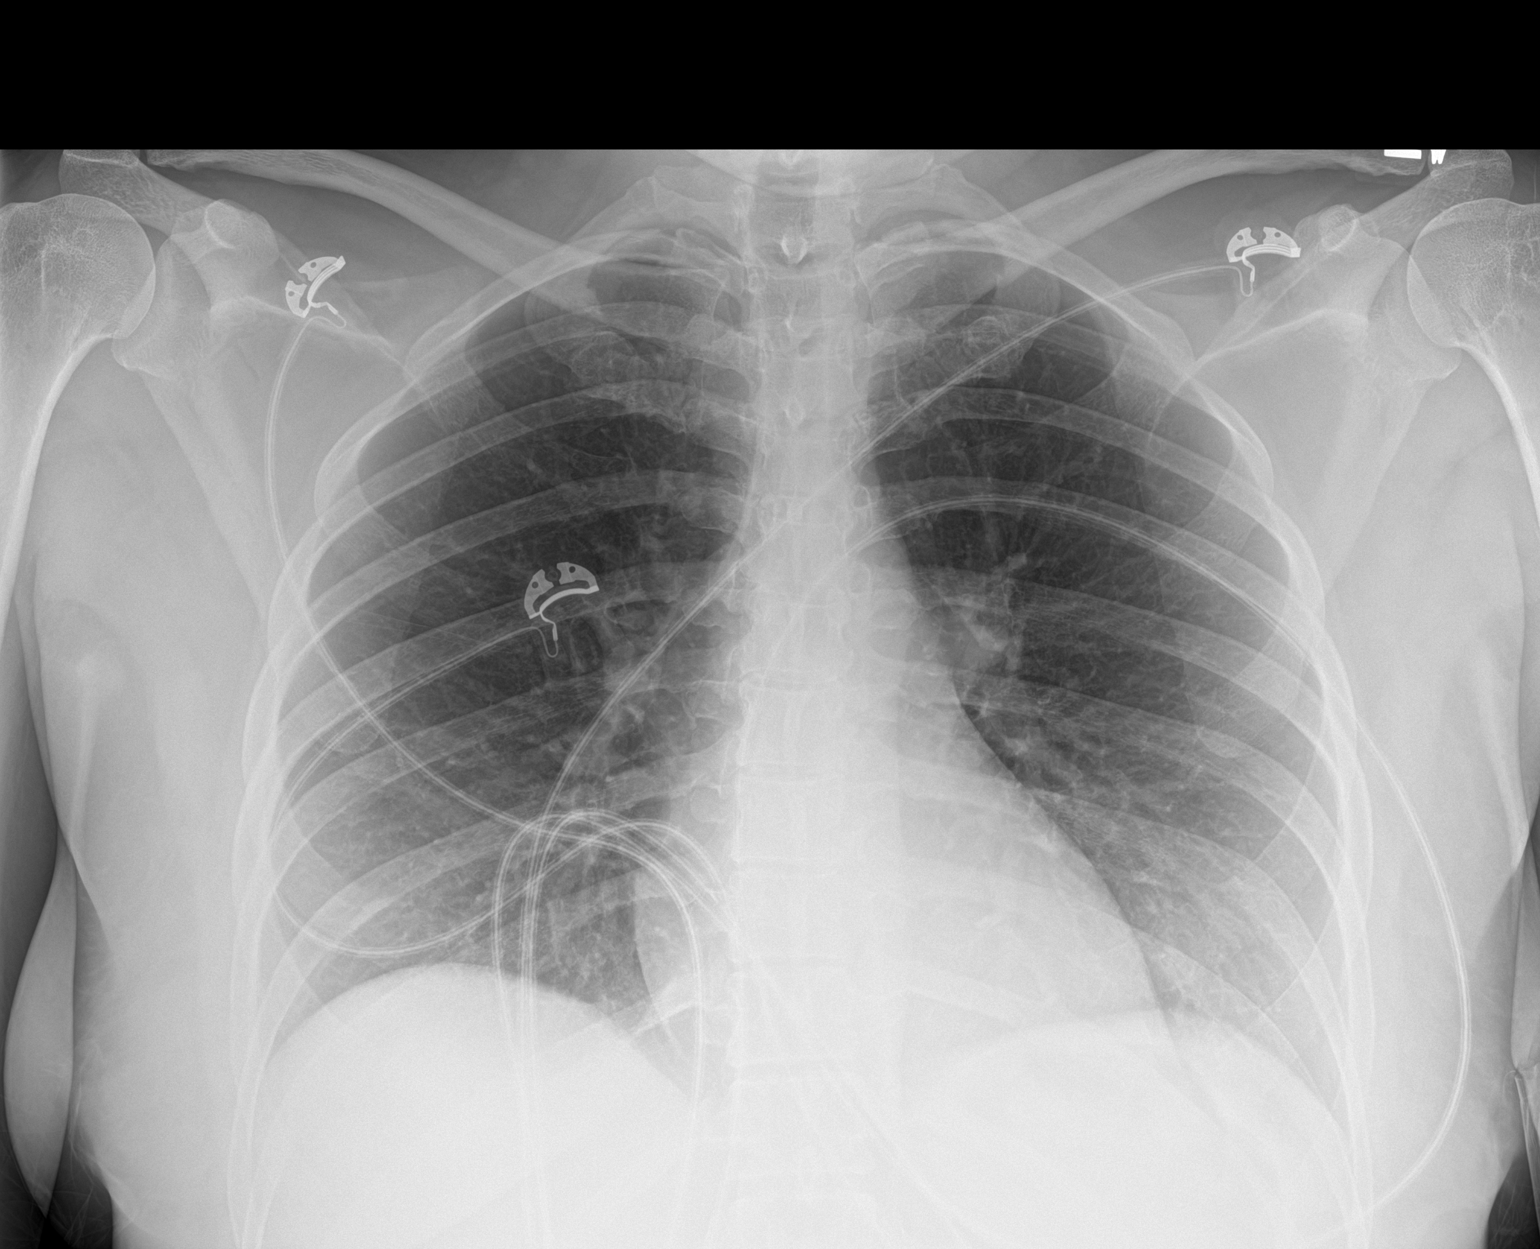

[1 of 1 positions shown; findings below may reference images not displayed]

FINDINGS: Mediastinum hilar structures normal. Low lung volumes. No focal
alveolar infiltrate. No pleural effusion or pneumothorax. Heart size
stable. No acute bony abnormality. Mild thoracic spine scoliosis.
IMPRESSION: 1.  Low lung volumes.  No acute pulmonary infiltrate.

2.  No acute bony abnormality.  Mild thoracic spine scoliosis.

## 2021-03-16 IMAGING — MR MRI CERVICAL SPINE WITHOUT AND WITH CONTRAST
4 of 8 series · 19 of 48 positions shown · IV contrast (gadavist)
Comparison: None.

CLINICAL DATA: Neck and left shoulder pain for 2 days.

EXAM:
MRI CERVICAL SPINE WITHOUT AND WITH CONTRAST
TECHNIQUE: Multiplanar and multiecho pulse sequences of the cervical spine, to
include the craniocervical junction and cervicothoracic junction,
were obtained without and with intravenous contrast.
CONTRAST:  8 cc Gadavist

[Series 2: T2 · sagittal · 3.0mm · 0.43mm/px · 3 of 15 slices shown (1 of 2)]
[im 1/15]
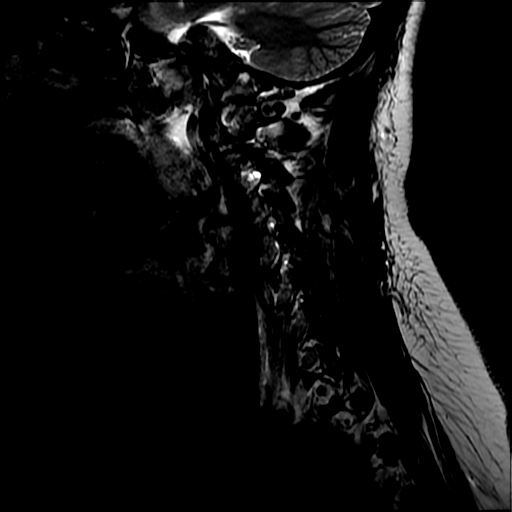
[im 8/15]
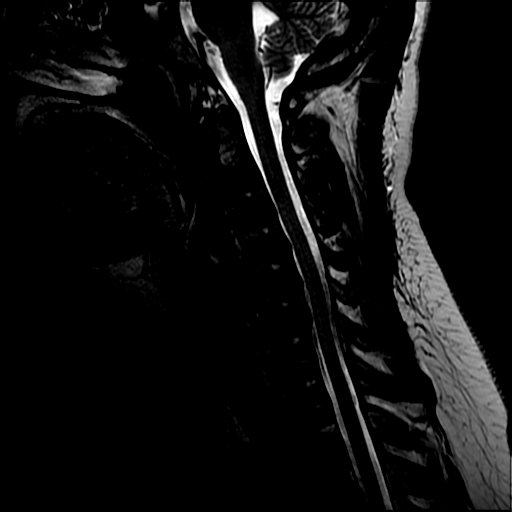
[im 15/15]
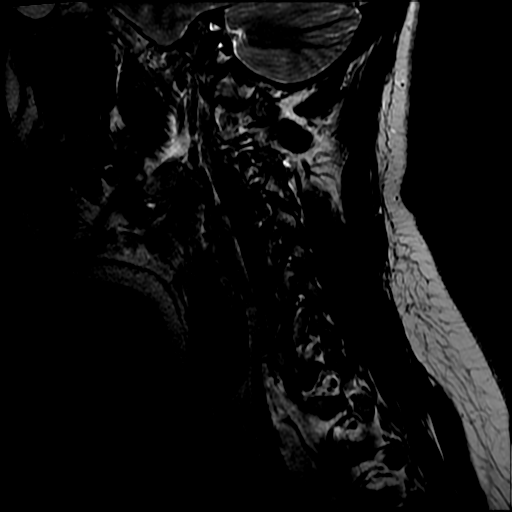

[Series 7: T1 · axial · non-contrast · 3.0mm · 0.35mm/px · z∈[-82,+10]mm · 5 of 34 slices shown]
[im 1/34]
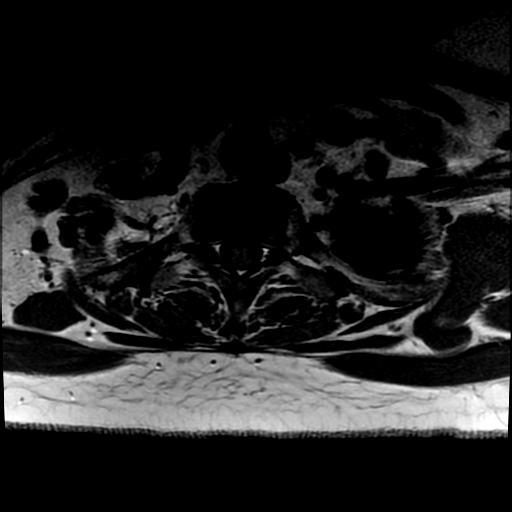
[im 5/34]
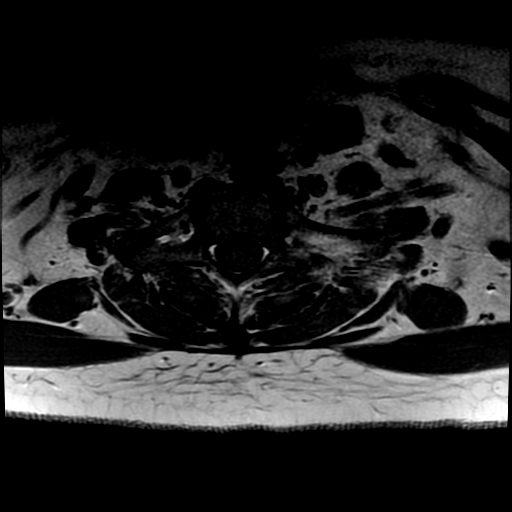
[im 10/34]
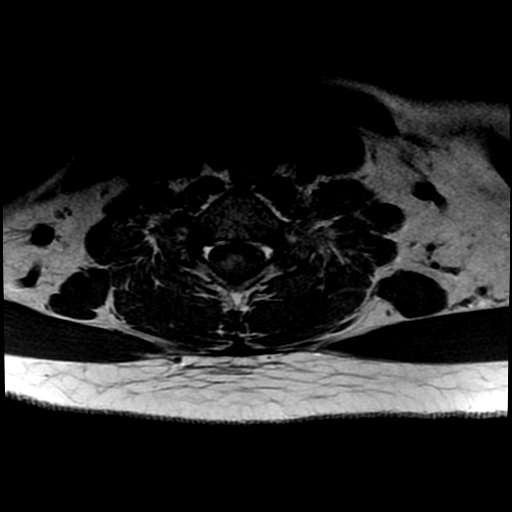
[im 19/34]
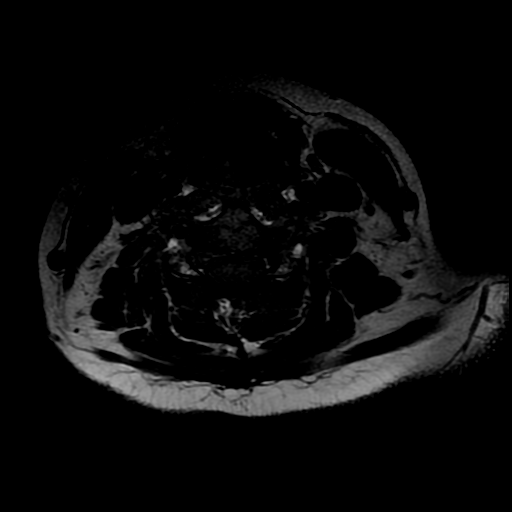
[im 29/34]
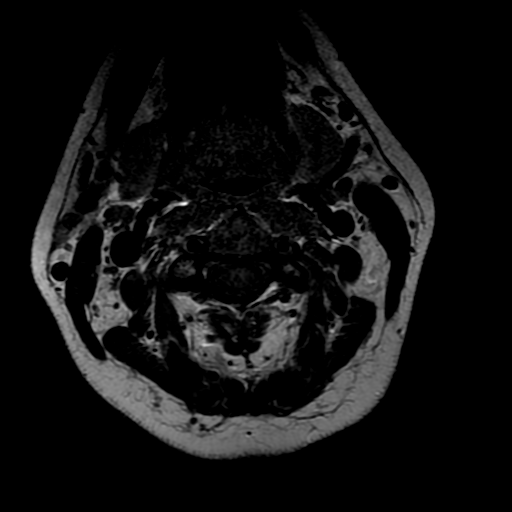

[Series 9: T1 fat-sat post-contrast · sagittal · 3.0mm · 0.43mm/px · 3 of 15 slices shown]
[im 1/15]
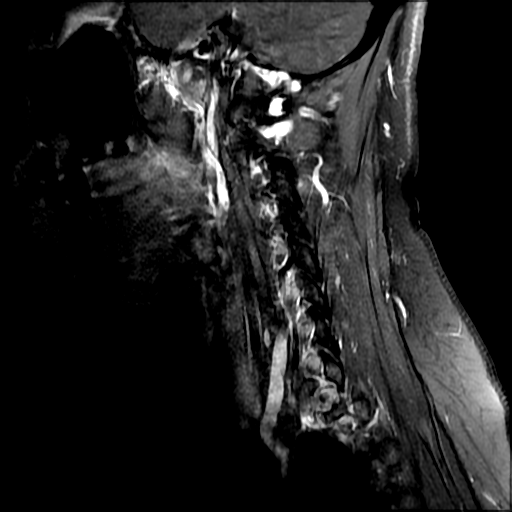
[im 10/15]
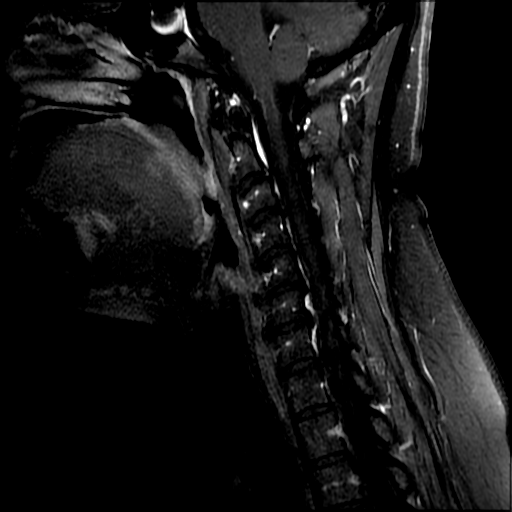
[im 15/15]
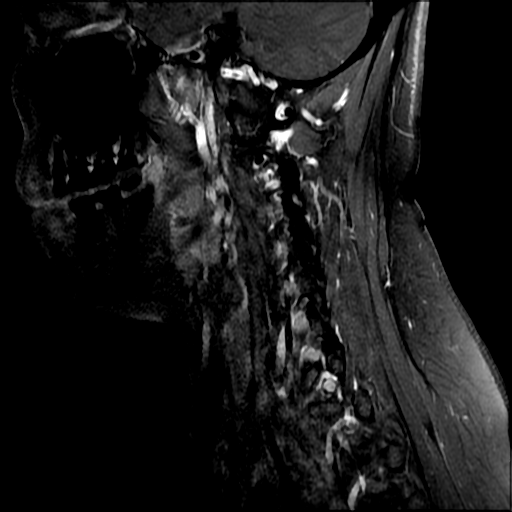

[Series 11: T2 · axial · 3.0mm · 0.35mm/px · z∈[-84,+23]mm · 8 of 34 slices shown (2 of 2)]
[im 1/34]
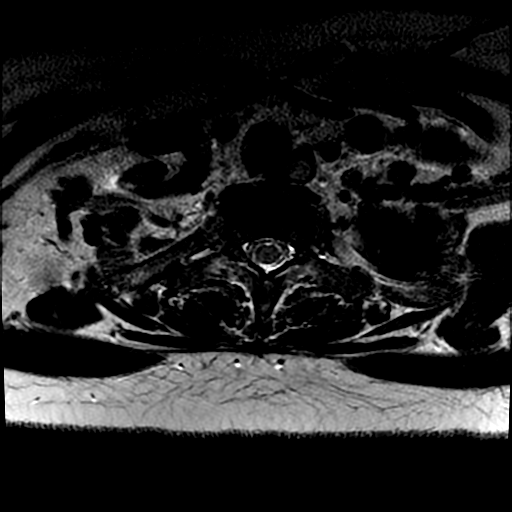
[im 5/34]
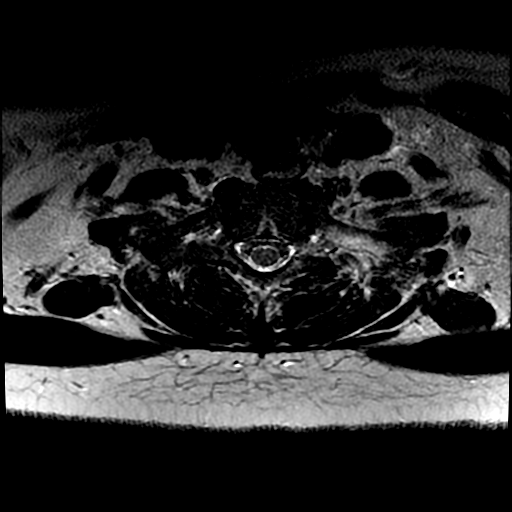
[im 10/34]
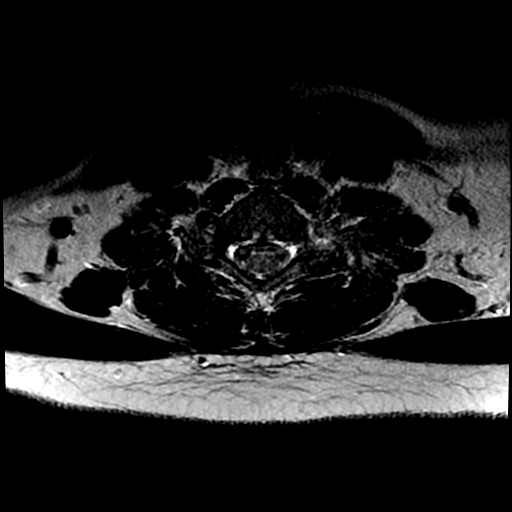
[im 15/34]
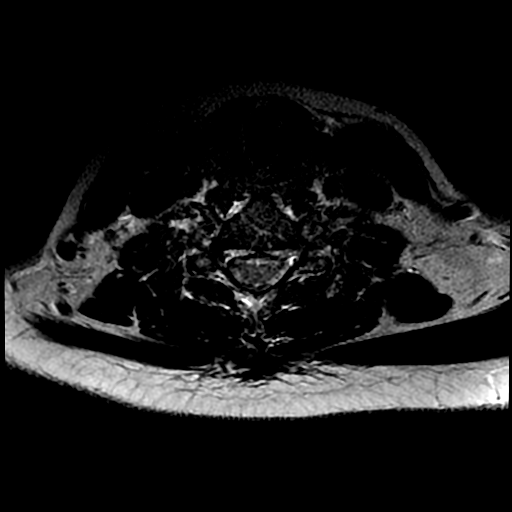
[im 19/34]
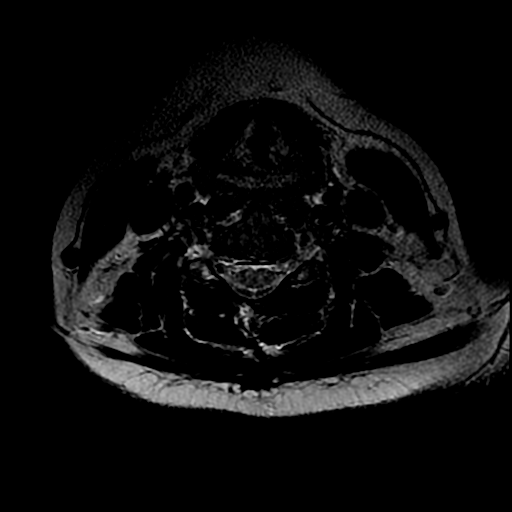
[im 24/34]
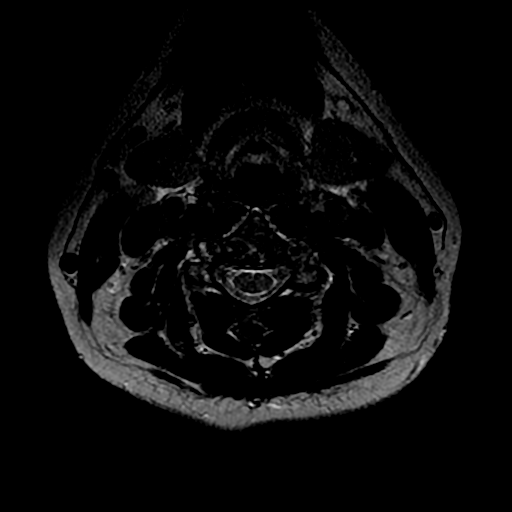
[im 29/34]
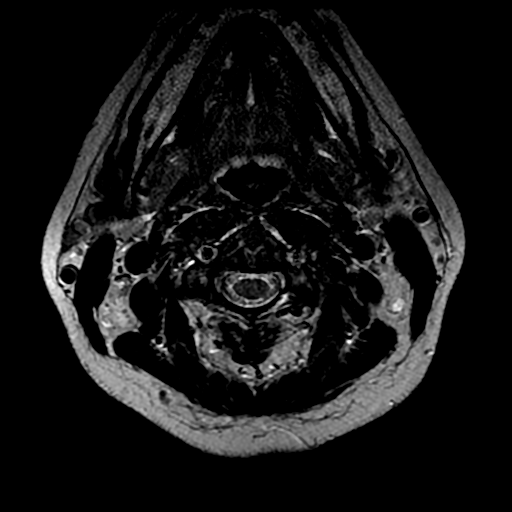
[im 34/34]
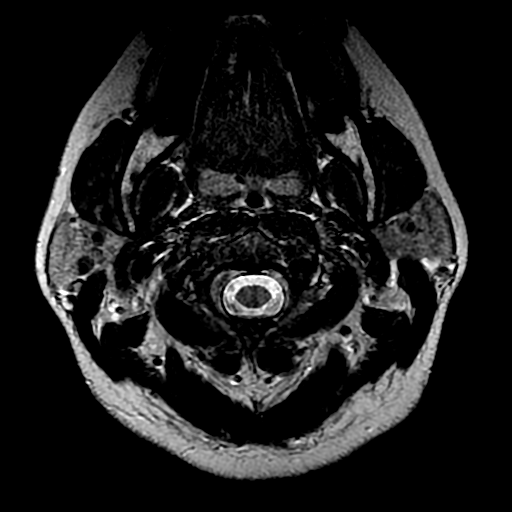

[19 of 48 positions shown; findings below may reference images not displayed]

FINDINGS: Alignment: Mild reversal of the normal cervical lordosis could be
due to positioning, muscle spasm or pain. The overall alignment is
maintained.

Vertebrae: No bone lesions or fractures.

Cord: Normal cord signal intensity. No cord lesions or syrinx. No
areas of abnormal cord enhancement. No Chiari malformation.

Posterior Fossa, vertebral arteries, paraspinal tissues: No
significant findings. Largely empty sella is noted.

Disc levels:

C2-3: No significant findings.

C3-4: No significant findings.

C4-5: Mild annular bulge with slight flattening of the ventral
thecal sac and mild narrowing the ventral CSF space but no spinal or
foraminal stenosis.

C5-6: Broad-based disc protrusion, osteophytic ridging and uncinate
spurring with mass effect on the thecal sac and narrowing the
ventral CSF space. There is also moderate right foraminal stenosis
likely effecting the right C6 nerve root.

C6-7: Broad-based disc protrusion and left-sided uncinate spurring
with flattening of the ventral thecal sac and narrowing the ventral
CSF space and mild left foraminal stenosis possibly irritating the
left C7 nerve root.

C7-T1: No significant findings.
IMPRESSION: 1. Broad-based disc protrusions at C5-6 and C6-7.
2. Right foraminal stenosis at C5-6 and left foraminal stenosis at
C6-7.

## 2021-05-27 ENCOUNTER — Emergency Department (HOSPITAL_BASED_OUTPATIENT_CLINIC_OR_DEPARTMENT_OTHER): Payer: BC Managed Care – PPO | Admitting: Radiology

## 2021-05-27 ENCOUNTER — Other Ambulatory Visit: Payer: Self-pay

## 2021-05-27 ENCOUNTER — Emergency Department (HOSPITAL_BASED_OUTPATIENT_CLINIC_OR_DEPARTMENT_OTHER)
Admission: EM | Admit: 2021-05-27 | Discharge: 2021-05-27 | Disposition: A | Discharge status: Home / Self Care | Payer: BC Managed Care – PPO | Attending: Emergency Medicine | Admitting: Emergency Medicine

## 2021-05-27 DIAGNOSIS — Z20822 Contact with and (suspected) exposure to covid-19: Secondary | ICD-10-CM | POA: Insufficient documentation

## 2021-05-27 DIAGNOSIS — J069 Acute upper respiratory infection, unspecified: Secondary | ICD-10-CM | POA: Insufficient documentation

## 2021-05-27 DIAGNOSIS — R0602 Shortness of breath: Secondary | ICD-10-CM | POA: Diagnosis present

## 2021-05-27 LAB — CBC WITH DIFFERENTIAL/PLATELET
Abs Immature Granulocytes: 0.01 10*3/uL (ref 0.00–0.07)
Basophils Absolute: 0 10*3/uL (ref 0.0–0.1)
Basophils Relative: 1 %
Eosinophils Absolute: 0.1 10*3/uL (ref 0.0–0.5)
Eosinophils Relative: 1 %
HCT: 36.5 % (ref 36.0–46.0)
Hemoglobin: 11.9 g/dL — ABNORMAL LOW (ref 12.0–15.0)
Immature Granulocytes: 0 %
Lymphocytes Relative: 26 %
Lymphs Abs: 2 10*3/uL (ref 0.7–4.0)
MCH: 27.2 pg (ref 26.0–34.0)
MCHC: 32.6 g/dL (ref 30.0–36.0)
MCV: 83.3 fL (ref 80.0–100.0)
Monocytes Absolute: 0.8 10*3/uL (ref 0.1–1.0)
Monocytes Relative: 10 %
Neutro Abs: 4.7 10*3/uL (ref 1.7–7.7)
Neutrophils Relative %: 62 %
Platelets: 386 10*3/uL (ref 150–400)
RBC: 4.38 MIL/uL (ref 3.87–5.11)
RDW: 13.2 % (ref 11.5–15.5)
WBC: 7.5 10*3/uL (ref 4.0–10.5)
nRBC: 0 % (ref 0.0–0.2)

## 2021-05-27 LAB — COMPREHENSIVE METABOLIC PANEL
ALT: 13 U/L (ref 0–44)
AST: 12 U/L — ABNORMAL LOW (ref 15–41)
Albumin: 4.3 g/dL (ref 3.5–5.0)
Alkaline Phosphatase: 68 U/L (ref 38–126)
Anion gap: 8 (ref 5–15)
BUN: 13 mg/dL (ref 6–20)
CO2: 27 mmol/L (ref 22–32)
Calcium: 9 mg/dL (ref 8.9–10.3)
Chloride: 103 mmol/L (ref 98–111)
Creatinine, Ser: 0.63 mg/dL (ref 0.44–1.00)
GFR, Estimated: >60 mL/min (ref >60)
Glucose, Bld: 84 mg/dL (ref 70–99)
Potassium: 4 mmol/L (ref 3.5–5.1)
Sodium: 138 mmol/L (ref 135–145)
Total Bilirubin: 0.3 mg/dL (ref 0.3–1.2)
Total Protein: 7.6 g/dL (ref 6.5–8.1)

## 2021-05-27 LAB — TROPONIN I (HIGH SENSITIVITY): Troponin I (High Sensitivity): <2 ng/L (ref <18)

## 2021-05-27 LAB — GROUP A STREP BY PCR: Group A Strep by PCR: NOT DETECTED

## 2021-05-27 LAB — RESP PANEL BY RT-PCR (FLU A&B, COVID) ARPGX2
Influenza A by PCR: NEGATIVE
Influenza B by PCR: NEGATIVE
SARS Coronavirus 2 by RT PCR: NEGATIVE

## 2021-05-27 LAB — BRAIN NATRIURETIC PEPTIDE: B Natriuretic Peptide: 28.6 pg/mL (ref 0.0–100.0)

## 2021-05-27 MED ORDER — ALBUTEROL SULFATE HFA 108 (90 BASE) MCG/ACT IN AERS: 2.0000 | INHALATION_SPRAY | RESPIRATORY_TRACT | Status: DC | PRN

## 2021-05-27 MED ORDER — BENZONATATE 100 MG PO CAPS: 100.0000 mg | ORAL_CAPSULE | Freq: Three times a day (TID) | ORAL | 0 refills | Status: AC

## 2021-05-27 MED ORDER — ACETAMINOPHEN 325 MG PO TABS
650.0000 mg | ORAL_TABLET | Freq: Once | ORAL | Status: AC
  Administered 2021-05-27: 650 mg via ORAL
  Filled 2021-05-27: qty 2

## 2021-05-27 MED ORDER — GUAIFENESIN ER 600 MG PO TB12: 600.0000 mg | ORAL_TABLET | Freq: Two times a day (BID) | ORAL | 0 refills | Status: DC

## 2021-05-27 NOTE — ED Provider Notes (Signed)
[facility] EMERGENC[facility] DEPT Provider Note   CSN: [id number] Arrival date & time: 05/27/21  1819     History  Chief Complaint  Patient presents with   Shortness of Breath    Paige Dunn is a 41 y.o. female.  HPI   Pt is a 41 y/o female with a h/o arthritis, who presents to the ED today for eval of flu like symptoms. She reports that for the last several days she has had sore throat, body aches, cough, ear pain, congestion, chest discomfort and shortness of breath that started 3 days ago. She denies associated fever, vomiting, diarrhea.   She denies sick contacts. She has not had her flu shot.   Home Medications Prior to Admission medications   Medication Sig Start Date End Date Taking? Authorizing Provider  benzonatate (TESSALON) 100 MG capsule Take 1 capsule (100 mg total) by mouth every 8 (eight) hours for 5 days. 05/27/21 06/01/21 Yes Rush Salce S, PA-C  guaiFENesin (MUCINEX) 600 MG 12 hr tablet Take 1 tablet (600 mg total) by mouth 2 (two) times daily. 05/27/21  Yes Berlinda Farve S, PA-C  cyclobenzaprine (FLEXERIL) 10 MG tablet Take 1 tablet (10 mg total) by mouth 3 (three) times daily as needed for muscle spasms. 01/27/21   Hassell Done, Mary-Margaret, FNP  estradiol (VIVELLE-DOT) 0.1 MG/24HR patch estradiol 0.1 mg/24 hr semiweekly transdermal patch  APPLY 1 PATCH(ES) TWICE A WEEK BY TRANSDERMAL ROUTE AS DIRECTED FOR 4 DAYS.    [provider]  gabapentin (NEURONTIN) 300 MG capsule Take 300 mg by mouth at bedtime.    [provider]  methocarbamol (ROBAXIN) 500 MG tablet Take 1 tablet (500 mg total) by mouth every 8 (eight) hours as needed for muscle spasms. 09/28/20   Lanae Crumbly, PA-C  methylPREDNISolone (MEDROL) 4 MG tablet Take 1 tablet (4 mg total) by mouth daily. 09/28/20   Lanae Crumbly, PA-C  traMADol (ULTRAM) 50 MG tablet Take 1 tablet (50 mg total) by mouth every 12 (twelve) hours as needed. 09/28/20   Lanae Crumbly, PA-C       Allergies    Aspirin and Lamictal [lamotrigine]    Review of Systems   Review of Systems See HPI for pertinent positives or negatives.   Physical Exam Updated Vital Signs BP 122/70    Pulse 75    Temp 97.9 F (36.6 C)    Resp 11    Ht 5' 10.5" (1.791 m)    Wt 83.9 kg    SpO2 100%    BMI 26.17 kg/m  Physical Exam Vitals and nursing note reviewed.  Constitutional:      General: She is not in acute distress.    Appearance: She is well-developed.  HENT:     Head: Normocephalic and atraumatic.     Mouth/Throat:     Pharynx: No oropharyngeal exudate or posterior oropharyngeal erythema.     Tonsils: 1+ on the right. 1+ on the left.  Eyes:     Conjunctiva/sclera: Conjunctivae normal.  Cardiovascular:     Rate and Rhythm: Normal rate and regular rhythm.     Heart sounds: No murmur heard. Pulmonary:     Effort: Pulmonary effort is normal. No respiratory distress.     Breath sounds: Normal breath sounds. No decreased breath sounds, wheezing or rhonchi.  Abdominal:     Palpations: Abdomen is soft.     Tenderness: There is no abdominal tenderness.  Musculoskeletal:        General: No  swelling.     Cervical back: Neck supple.  Skin:    General: Skin is warm and dry.     Capillary Refill: Capillary refill takes less than 2 seconds.  Neurological:     Mental Status: She is alert.  Psychiatric:        Mood and Affect: Mood normal.    ED Results / Procedures / Treatments   Labs (all labs ordered are listed, but only abnormal results are displayed) Labs Reviewed  CBC WITH DIFFERENTIAL/PLATELET - Abnormal; Notable for the following components:      Result Value   Hemoglobin 11.9 (*)    All other components within normal limits  COMPREHENSIVE METABOLIC PANEL - Abnormal; Notable for the following components:   AST 12 (*)    All other components within normal limits  GROUP A STREP BY PCR  RESP PANEL BY RT-PCR (FLU A&B, COVID) ARPGX2  BRAIN NATRIURETIC PEPTIDE  TROPONIN I  (HIGH SENSITIVITY)    EKG None  Radiology DG Chest 2 View  Result Date: 05/27/2021 CLINICAL DATA:  Shortness of breath EXAM: CHEST - 2 VIEW COMPARISON:  10/09/2018 FINDINGS: The cardiomediastinal silhouette is unremarkable. There is no evidence of focal airspace disease, pulmonary edema, suspicious pulmonary nodule/mass, pleural effusion, or pneumothorax. No acute bony abnormalities are identified. Cervical spine fusion hardware noted. IMPRESSION: No active cardiopulmonary disease. Electronically Signed   By: Margarette Canada M.D.   On: 05/27/2021 18:51    Procedures Procedures    Medications Ordered in ED Medications  albuterol (VENTOLIN HFA) 108 (90 Base) MCG/ACT inhaler 2 puff (has no administration in time range)  acetaminophen (TYLENOL) tablet 650 mg (has no administration in time range)    ED Course/ Medical Decision Making/ A&P                           Medical Decision Making Amount and/or Complexity of Data Reviewed Labs: ordered. Radiology: ordered.  Risk Prescription drug management.   Pt here for URI sxs. CXR negative for acute infiltrate. Cardiac workup ordered in triage and was reassuring. I highly doubt acs or other emergent cardiopulmonary etiology. Patients symptoms are consistent with URI, likely viral etiology. Discussed that antibiotics are not indicated for viral infections. Pt will be discharged with symptomatic treatment.  Verbalizes understanding and is agreeable with plan. Pt is hemodynamically stable & in NAD prior to dc  Final Clinical Impression(s) / ED Diagnoses Final diagnoses:  Viral URI    Rx / DC Orders ED Discharge Orders          Ordered    benzonatate (TESSALON) 100 MG capsule  Every 8 hours        05/27/21 2034    guaiFENesin (MUCINEX) 600 MG 12 hr tablet  2 times daily        05/27/21 2034              Bishop Dublin 05/27/21 2034    Gareth Morgan, MD 05/29/21 2249

## 2021-05-27 NOTE — Discharge Instructions (Addendum)
Your work-up today was reassuring.  Your COVID and flu test were negative.  Your cardiac work-up did not show any signs of a heart attack.  Your chest x-ray did not show any signs of pneumonia.  I suspect that you likely have a viral upper respiratory infection such as a common cold.  We will treat you with supportive medications for your cough and congestion.  Please make sure to stay well-hydrated at home.  Please follow up with your primary care provider within 5-7 days for re-evaluation of your symptoms. If you do not have a primary care provider, information for a healthcare clinic has been provided for you to make arrangements for follow up care. Please return to the emergency department for any new or worsening symptoms.

## 2021-05-27 NOTE — ED Triage Notes (Signed)
Patient reports to the ER for flu like symptoms and ShoB. Patient reports home COVID test was negative. Reports she started with sore throat on Friday and has gotten progressively worse until she started having increased Shortness of Breath and exertional chest pain.

## 2021-11-15 NOTE — Progress Notes (Deleted)
Patient ID: Paige Dunn, female    DOB: Aug 06, 1980  MRN: [id number]  CC: Reestablish Care   Subjective: Paige Dunn is a 41 y.o. female who presents for reestablish care.   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 30 tablet 1   estradiol (VIVELLE-DOT) 0.1 MG/24HR patch estradiol 0.1 mg/24 hr semiweekly transdermal patch  APPLY 1 PATCH(ES) TWICE A WEEK BY TRANSDERMAL ROUTE AS DIRECTED FOR 4 DAYS.     gabapentin (NEURONTIN) 300 MG capsule Take 300 mg by mouth at bedtime.     guaiFENesin (MUCINEX) 600 MG 12 hr tablet Take 1 tablet (600 mg total) by mouth 2 (two) times daily. 6 tablet 0   methocarbamol (ROBAXIN) 500 MG tablet Take 1 tablet (500 mg total) by mouth every 8 (eight) hours as needed for muscle spasms. 60 tablet 0   methylPREDNISolone (MEDROL) 4 MG tablet Take 1 tablet (4 mg total) by mouth daily. 21 tablet 0   traMADol (ULTRAM) 50 MG tablet Take 1 tablet (50 mg total) by mouth every 12 (twelve) hours as needed. 30 tablet 0   No current facility-administered medications on file prior to visit.    Allergies  Allergen Reactions   Aspirin Nausea And Vomiting and Other (See Comments)    Tremors, dizziness, upset stomach   Lamictal [Lamotrigine]     Neuromuscular discomfort    Social History   Socioeconomic History   Marital status: Married    Spouse name: Not on file   Number of children: Not on file   Years of education: Not on file   Highest education level: Not on file  Occupational History   Not on file  Tobacco Use   Smoking status: Never   Smokeless tobacco: Never   Substance and Sexual Activity   Alcohol use: No   Drug use: No   Sexual activity: Yes    Partners: Male  Other Topics Concern   Not on file  Social History Narrative   Patient is married and lives with her husband.    Patient has 2 children.    Social Determinants of Health   Financial Resource Strain: Not on file  Food Insecurity: Not on file  Transportation Needs: Not on file  Physical Activity: Not on file  Stress: Not on file  Social Connections: Not on file  Intimate Partner Violence: Not on file    Family History  Problem Relation Age of Onset   Heart attack Father    Hypertension Maternal Grandmother     Past Surgical History:  Procedure Laterality Date   ANTERIOR CERVICAL DECOMP/DISCECTOMY FUSION N/A 11/23/2018   Procedure: cervical 5-6, cervical 6-7 ANTERIOR CERVICAL DECOMPRESSION/DISCECTOMY FUSION, ALLOGRAFT, PLATE;  Surgeon: Marybelle Killings, MD;  Location: [facility];  Service: Orthopedics;  Laterality: N/A;   CERVICAL ABLATION  2015   CESAREAN SECTION     DILATION AND CURETTAGE OF UTERUS     DILATION AND EVACUATION  08/16/2011   Procedure: DILATATION AND EVACUATION;  Surgeon: Alwyn Pea, MD;  Location: [facility];  Service: Gynecology;  Laterality: N/A;  HYSTEROSCOPY  2004   LAPAROSCOPY  2004    ROS: Review of Systems Negative except as stated above  PHYSICAL EXAM: There were no vitals taken for this visit.  Physical Exam  {female adult master:310786} {female adult master:[id number]}     Latest Ref Rng & Units 05/27/2021    6:50 PM 11/17/2018   11:47 AM 10/09/2018   12:04 PM  CMP  Glucose 70 - 99 mg/dL 84  95  138   BUN 6 - 20 mg/dL '13  5  12   '$ Creatinine 0.44 - 1.00 mg/dL 0.63  0.60  0.75   Sodium 135 - 145 mmol/L 138  139  136   Potassium 3.5 - 5.1 mmol/L 4.0  4.0  3.9   Chloride 98 - 111 mmol/L 103  105  104   CO2 22 - 32 mmol/L '27  26  24   '$ Calcium 8.9 - 10.3 mg/dL 9.0  9.3  9.0   Total Protein 6.5 - 8.1 g/dL 7.6  7.3    Total Bilirubin 0.3 - 1.2  mg/dL 0.3  0.7    Alkaline Phos 38 - 126 U/L 68  64    AST 15 - 41 U/L 12  19    ALT 0 - 44 U/L 13  20     Lipid Panel     Component Value Date/Time   CHOL 195 05/25/2018 1121   TRIG 103 05/25/2018 1121   HDL 40 05/25/2018 1121   CHOLHDL 4.9 (H) 05/25/2018 1121   LDLCALC 134 (H) 05/25/2018 1121    CBC    Component Value Date/Time   WBC 7.5 05/27/2021 1850   RBC 4.38 05/27/2021 1850   HGB 11.9 (L) 05/27/2021 1850   HGB 12.4 05/25/2018 1121   HCT 36.5 05/27/2021 1850   HCT 37.3 05/25/2018 1121   PLT 386 05/27/2021 1850   PLT 396 05/25/2018 1121   MCV 83.3 05/27/2021 1850   MCV 84 05/25/2018 1121   MCH 27.2 05/27/2021 1850   MCHC 32.6 05/27/2021 1850   RDW 13.2 05/27/2021 1850   RDW 12.7 05/25/2018 1121   LYMPHSABS 2.0 05/27/2021 1850   LYMPHSABS 1.6 05/25/2018 1121   MONOABS 0.8 05/27/2021 1850   EOSABS 0.1 05/27/2021 1850   EOSABS 0.1 05/25/2018 1121   BASOSABS 0.0 05/27/2021 1850   BASOSABS 0.0 05/25/2018 1121    ASSESSMENT AND PLAN:  There are no diagnoses linked to this encounter.   Patient was given the opportunity to ask questions.  Patient verbalized understanding of the plan and was able to repeat key elements of the plan. Patient was given clear instructions to go to Emergency Department or return to medical center if symptoms don't improve, worsen, or new problems develop.The patient verbalized understanding.   No orders of the defined types were placed in this encounter.    Requested Prescriptions    No prescriptions requested or ordered in this encounter    No follow-ups on file.  Camillia Herter, NP

## 2021-11-16 ENCOUNTER — Ambulatory Visit: Payer: BC Managed Care – PPO | Admitting: Family

## 2021-12-03 NOTE — Progress Notes (Signed)
Erroneous encounter-disregard

## 2021-12-12 ENCOUNTER — Encounter: Payer: BC Managed Care – PPO | Admitting: Family

## 2021-12-12 DIAGNOSIS — Z13220 Encounter for screening for lipoid disorders: Secondary | ICD-10-CM

## 2021-12-12 DIAGNOSIS — Z1329 Encounter for screening for other suspected endocrine disorder: Secondary | ICD-10-CM

## 2021-12-12 DIAGNOSIS — Z131 Encounter for screening for diabetes mellitus: Secondary | ICD-10-CM

## 2021-12-12 DIAGNOSIS — Z1159 Encounter for screening for other viral diseases: Secondary | ICD-10-CM

## 2021-12-12 DIAGNOSIS — Z1231 Encounter for screening mammogram for malignant neoplasm of breast: Secondary | ICD-10-CM

## 2021-12-12 DIAGNOSIS — Z0000 Encounter for general adult medical examination without abnormal findings: Secondary | ICD-10-CM

## 2022-01-07 ENCOUNTER — Emergency Department (HOSPITAL_COMMUNITY)
Admission: EM | Admit: 2022-01-07 | Discharge: 2022-01-07 | Disposition: A | Discharge status: Home / Self Care | Payer: BC Managed Care – PPO | Attending: Emergency Medicine | Admitting: Emergency Medicine

## 2022-01-07 ENCOUNTER — Emergency Department (HOSPITAL_COMMUNITY): Payer: BC Managed Care – PPO

## 2022-01-07 DIAGNOSIS — D649 Anemia, unspecified: Secondary | ICD-10-CM | POA: Insufficient documentation

## 2022-01-07 DIAGNOSIS — G40909 Epilepsy, unspecified, not intractable, without status epilepticus: Secondary | ICD-10-CM | POA: Diagnosis not present

## 2022-01-07 DIAGNOSIS — R569 Unspecified convulsions: Secondary | ICD-10-CM | POA: Diagnosis present

## 2022-01-07 LAB — CBC WITH DIFFERENTIAL/PLATELET
Abs Immature Granulocytes: 0.01 10*3/uL (ref 0.00–0.07)
Basophils Absolute: 0 10*3/uL (ref 0.0–0.1)
Basophils Relative: 1 %
Eosinophils Absolute: 0 10*3/uL (ref 0.0–0.5)
Eosinophils Relative: 1 %
HCT: 36.1 % (ref 36.0–46.0)
Hemoglobin: 11.6 g/dL — ABNORMAL LOW (ref 12.0–15.0)
Immature Granulocytes: 0 %
Lymphocytes Relative: 27 %
Lymphs Abs: 1.8 10*3/uL (ref 0.7–4.0)
MCH: 27.2 pg (ref 26.0–34.0)
MCHC: 32.1 g/dL (ref 30.0–36.0)
MCV: 84.7 fL (ref 80.0–100.0)
Monocytes Absolute: 0.6 10*3/uL (ref 0.1–1.0)
Monocytes Relative: 9 %
Neutro Abs: 4.2 10*3/uL (ref 1.7–7.7)
Neutrophils Relative %: 62 %
Platelets: 380 10*3/uL (ref 150–400)
RBC: 4.26 MIL/uL (ref 3.87–5.11)
RDW: 13.2 % (ref 11.5–15.5)
WBC: 6.7 10*3/uL (ref 4.0–10.5)
nRBC: 0 % (ref 0.0–0.2)

## 2022-01-07 LAB — COMPREHENSIVE METABOLIC PANEL
ALT: 19 U/L (ref 0–44)
AST: 22 U/L (ref 15–41)
Albumin: 4 g/dL (ref 3.5–5.0)
Alkaline Phosphatase: 66 U/L (ref 38–126)
Anion gap: 10 (ref 5–15)
BUN: 12 mg/dL (ref 6–20)
CO2: 20 mmol/L — ABNORMAL LOW (ref 22–32)
Calcium: 9.3 mg/dL (ref 8.9–10.3)
Chloride: 105 mmol/L (ref 98–111)
Creatinine, Ser: 0.7 mg/dL (ref 0.44–1.00)
GFR, Estimated: >60 mL/min (ref >60)
Glucose, Bld: 183 mg/dL — ABNORMAL HIGH (ref 70–99)
Potassium: 4.6 mmol/L (ref 3.5–5.1)
Sodium: 135 mmol/L (ref 135–145)
Total Bilirubin: 0.7 mg/dL (ref 0.3–1.2)
Total Protein: 7.5 g/dL (ref 6.5–8.1)

## 2022-01-07 MED ORDER — METOCLOPRAMIDE HCL 5 MG/ML IJ SOLN
10.0000 mg | Freq: Once | INTRAMUSCULAR | Status: AC
  Administered 2022-01-07: 10 mg via INTRAVENOUS
  Filled 2022-01-07: qty 2

## 2022-01-07 MED ORDER — CYCLOBENZAPRINE HCL 10 MG PO TABS
10.0000 mg | ORAL_TABLET | Freq: Once | ORAL | Status: AC
  Administered 2022-01-07: 10 mg via ORAL
  Filled 2022-01-07: qty 1

## 2022-01-07 MED ORDER — DIPHENHYDRAMINE HCL 50 MG/ML IJ SOLN
12.5000 mg | Freq: Once | INTRAMUSCULAR | Status: AC
Start: 2022-01-07 — End: 2022-01-07
  Administered 2022-01-07: 12.5 mg via INTRAVENOUS
  Filled 2022-01-07: qty 1

## 2022-01-07 MED ORDER — CARBAMAZEPINE 100 MG PO CHEW: 100.0000 mg | CHEWABLE_TABLET | Freq: Two times a day (BID) | ORAL | 0 refills | Status: DC

## 2022-01-07 MED ORDER — SODIUM CHLORIDE 0.9 % IV BOLUS
500.0000 mL | Freq: Once | INTRAVENOUS | Status: AC
  Administered 2022-01-07: 500 mL via INTRAVENOUS

## 2022-01-07 MED ORDER — CARBAMAZEPINE 100 MG PO CHEW
100.0000 mg | CHEWABLE_TABLET | Freq: Once | ORAL | Status: AC
  Administered 2022-01-07: 100 mg via ORAL
  Filled 2022-01-07: qty 1

## 2022-01-07 NOTE — ED Notes (Signed)
Patient has a urine sample and urine culture in the main lab

## 2022-01-07 NOTE — ED Provider Notes (Signed)
[facility] DEPT Provider Note   CSN: [id number] Arrival date & time: 01/07/22  1093     History  Chief Complaint  Patient presents with   Seizures    Paige Dunn is a 41 y.o. female.  HPI   Medical history including epilepsy, presents with complaint of a seizure.  Patient states that she does not remember exact what happened, but she states that she remembers laying in bed reading and then woke up with EMS around her.  She states that she has some tongue pain and a slight headache as well as some pain in her right ankle but she denies any change in vision paresthesias or weakness of her lower extremities.  Patient states that she used to take Tegretol but this was discontinued by her neurologist that she did not had any seizures in a long time, states that her last seizure was in 2014, she is not having any complaints.    Husband at bedside he endorsed that while they were laying in bed patient rolled to her right side kicked him and then had full body shaking, lasted about 1 minute, and she was postictal afterwards, no urinary constant, there was evidence of tongue biting.    Home Medications Prior to Admission medications   Medication Sig Start Date End Date Taking? Authorizing Provider  carbamazepine (TEGRETOL) 100 MG chewable tablet Chew 1 tablet (100 mg total) by mouth in the morning and at bedtime. 01/07/22 02/06/22 Yes Marcello Fennel, PA-C  estradiol (VIVELLE-DOT) 0.1 MG/24HR patch estradiol 0.1 mg/24 hr semiweekly transdermal patch  APPLY 1 PATCH(ES) TWICE A WEEK BY TRANSDERMAL ROUTE AS DIRECTED FOR 4 DAYS.   Yes [provider]      Allergies    Aspirin and Lamictal [lamotrigine]    Review of Systems   Review of Systems  Constitutional:  Negative for chills and fever.  Respiratory:  Negative for shortness of breath.   Cardiovascular:  Negative for chest pain.  Gastrointestinal:  Negative for abdominal pain.  Neurological:   Positive for seizures. Negative for headaches.    Physical Exam Updated Vital Signs BP 111/88   Pulse 86   Temp 98.1 F (36.7 C)   Resp 16   SpO2 96%  Physical Exam Vitals and nursing note reviewed.  Constitutional:      General: She is not in acute distress.    Appearance: She is not ill-appearing.  HENT:     Head: Normocephalic and atraumatic.     Comments: There is no deformity of the head present no raccoon eyes or battle sign noted.    Nose: No congestion.     Mouth/Throat:     Mouth: Mucous membranes are moist.     Pharynx: Oropharynx is clear.     Comments: No trismus no torticollis, she has an abrasion on the lateral aspect of her tongue. Eyes:     Extraocular Movements: Extraocular movements intact.     Conjunctiva/sclera: Conjunctivae normal.     Pupils: Pupils are equal, round, and reactive to light.  Cardiovascular:     Rate and Rhythm: Normal rate and regular rhythm.     Pulses: Normal pulses.     Heart sounds: No murmur heard.    No friction rub. No gallop.  Pulmonary:     Effort: No respiratory distress.     Breath sounds: No wheezing, rhonchi or rales.  Abdominal:     Palpations: Abdomen is soft.     Tenderness: There  is no abdominal tenderness. There is no right CVA tenderness or left CVA tenderness.  Musculoskeletal:     Comments: Point tenderness noted on her right ankle, mainly around her right heel, there is no deformities present, Achilles tendon is fully intact, no evidence of defect, 2+ dorsal pedal pulses 2-second capillary refill.  She is moving her toes ankle knee without difficulty.  Skin:    General: Skin is warm and dry.  Neurological:     Mental Status: She is alert.     GCS: GCS eye subscore is 4. GCS verbal subscore is 5. GCS motor subscore is 6.     Cranial Nerves: Cranial nerves 2-12 are intact.     Sensory: Sensation is intact.     Motor: No weakness.     Coordination: Romberg sign negative. Finger-Nose-Finger Test and Heel to [facility]  Test normal.     Comments: No facial asymmetry no difficulty with word finding following two-step commands no unilateral weakness present.  Psychiatric:        Mood and Affect: Mood normal.     ED Results / Procedures / Treatments   Labs (all labs ordered are listed, but only abnormal results are displayed) Labs Reviewed  CBC WITH DIFFERENTIAL/PLATELET - Abnormal; Notable for the following components:      Result Value   Hemoglobin 11.6 (*)    All other components within normal limits  COMPREHENSIVE METABOLIC PANEL - Abnormal; Notable for the following components:   CO2 20 (*)    Glucose, Bld 183 (*)    All other components within normal limits    EKG EKG Interpretation  Date/Time:  Monday January 07 2022 02:43:24 EDT Ventricular Rate:  94 PR Interval:  176 QRS Duration: 84 QT Interval:  354 QTC Calculation: 443 R Axis:   65 Text Interpretation: Sinus rhythm RSR' in V1 or V2, probably normal variant Confirmed by Quintella Reichert [phone]) on 01/07/2022 2:44:50 AM  Radiology CT Head Wo Contrast  Result Date: 01/07/2022 CLINICAL DATA:  Witness seizure. EXAM: CT HEAD WITHOUT CONTRAST TECHNIQUE: Contiguous axial images were obtained from the base of the skull through the vertex without intravenous contrast. RADIATION DOSE REDUCTION: This exam was performed according to the departmental dose-optimization program which includes automated exposure control, adjustment of the mA and/or kV according to patient size and/or use of iterative reconstruction technique. COMPARISON:  12/26/2012 FINDINGS: Brain: There is no evidence for acute hemorrhage, hydrocephalus, mass lesion, or abnormal extra-axial fluid collection. No definite CT evidence for acute infarction. Vascular: No hyperdense vessel or unexpected calcification. Skull: Normal. Negative for fracture or focal lesion. Sinuses/Orbits: No acute finding. Other: None. IMPRESSION: No acute intracranial abnormality. Electronically Signed   By:  Misty Stanley M.D.   On: 01/07/2022 05:16    Procedures Procedures    Medications Ordered in ED Medications  carbamazepine (TEGRETOL) chewable tablet 100 mg (has no administration in time range)  metoCLOPramide (REGLAN) injection 10 mg (10 mg Intravenous Given 01/07/22 0412)  diphenhydrAMINE (BENADRYL) injection 12.5 mg (12.5 mg Intravenous Given 01/07/22 0411)  sodium chloride 0.9 % bolus 500 mL (0 mLs Intravenous Stopped 01/07/22 0528)  cyclobenzaprine (FLEXERIL) tablet 10 mg (10 mg Oral Given 01/07/22 0413)    ED Course/ Medical Decision Making/ A&P                           Medical Decision Making Amount and/or Complexity of Data Reviewed Labs: ordered. Radiology: ordered.  Risk Prescription drug  management.   This patient presents to the ED for concern of seizures, this involves an extensive number of treatment options, and is a complaint that carries with it a high risk of complications and morbidity.  The differential diagnosis includes electrolyte derailment, sepsis, withdrawals, cranial mass    Additional history obtained:  Additional history obtained from husband at bedside External records from outside source obtained and reviewed including previous neurology notes   Co morbidities that complicate the patient evaluation  Epilepsy  Social Determinants of Health:  N/A    Lab Tests:  I Ordered, and personally interpreted labs.  The pertinent results include: CBC shows normocytic anemia hemoglobin 9.6, CMP shows CO2 of 20, glucose 183   Imaging Studies ordered:  I ordered imaging studies including CT head I independently visualized and interpreted imaging which showed negative acute findings I agree with the radiologist interpretation   Cardiac Monitoring:  The patient was maintained on a cardiac monitor.  I personally viewed and interpreted the cardiac monitored which showed an underlying rhythm of: Without signs of ischemia   Medicines ordered and  prescription drug management:  I ordered medication including migraine cocktail I have reviewed the patients home medicines and have made adjustments as needed  Critical Interventions:  N/A   Reevaluation:  Presents with a seizure, presentation seems consistent with this, she has benign physical exam, will obtain CT head and consult with neurology for further recommendations.  Noted by nursing staff that patient was having a migraine, will provide with a migraine cocktail.  Reassessed resting comfortably, she is agreement plan and discharge at this time.   Consultations Obtained:  I requested consultation with the Dr. Lorrin Goodell of neurology,  and discussed lab and imaging findings as well as pertinent plan - they recommend: Start patient back on Tegretol 100 twice daily follow-up with neurology.    Test Considered:  N/A    Rule out low suspicion for internal head bleed and or mass as CT imaging is negative for acute findings.  Low suspicion for CVA she has no focal deficit present my exam.  Low suspicion for dissection of the vertebral or carotid artery as presentation atypical of etiology.  Low suspicion for meningitis as she has no meningeal sign present.  Low suspicion for metabolic derailments as lab work is unremarkable.     Dispostion and problem list  After consideration of the diagnostic results and the patients response to treatment, I feel that the patent would benefit from discharge.  Seizures -we will start patient back on seizure medication, follow-up with neurology for further evaluation, educated on seizure precautions i.e. no driving for at least 6 months, recommend no swimming.             Final Clinical Impression(s) / ED Diagnoses Final diagnoses:  Seizure disorder [facility])    Rx / DC Orders ED Discharge Orders          Ordered    carbamazepine (TEGRETOL) 100 MG chewable tablet  2 times daily        01/07/22 0546               Marcello Fennel, PA-C 01/07/22 6314    Quintella Reichert, MD 01/07/22 [phone]

## 2022-01-07 NOTE — ED Triage Notes (Addendum)
Seizure for 1 min witnessed by husband in bed. No injuries. Was post ictal per EMS. Used to take seizure meds but does not now. Has hx of seizure d/o. CBG 169. Husband OTW

## 2022-01-07 NOTE — Discharge Instructions (Signed)
Likely you had a seizure, started you on your seizure medications please take as prescribed.  Per [facility] law you are not allowed to drive for least 6 months, I recommend not swimming  It is recommend that he follow-up with your neurologist for further evaluation.  Come back to the emergency department if you develop chest pain, shortness of breath, severe abdominal pain, uncontrolled nausea, vomiting, diarrhea.

## 2022-01-08 ENCOUNTER — Telehealth: Payer: Self-pay | Admitting: *Deleted

## 2022-01-08 NOTE — Telephone Encounter (Signed)
Patient has been called and given appt for 01/22/2022

## 2022-01-09 NOTE — Telephone Encounter (Signed)
Husband Dellis Filbert calling again today. He has concerns the pt needs earlier appt and would like to know if that might be possible.

## 2022-01-09 NOTE — Telephone Encounter (Signed)
Patient was called and suggest that she may call in the mornings to see if there has been any cancellations for earlier appointments. Patient has verbalize understanding

## 2022-01-22 ENCOUNTER — Ambulatory Visit (INDEPENDENT_AMBULATORY_CARE_PROVIDER_SITE_OTHER): Payer: BC Managed Care – PPO | Admitting: Family Medicine

## 2022-01-22 ENCOUNTER — Encounter: Payer: Self-pay | Admitting: Family Medicine

## 2022-01-22 VITALS — BP 111/74 | HR 87 | Temp 98.1°F | Resp 16 | Ht 70.0 in | Wt 199.0 lb

## 2022-01-22 DIAGNOSIS — Z7689 Persons encountering health services in other specified circumstances: Secondary | ICD-10-CM | POA: Diagnosis not present

## 2022-01-22 DIAGNOSIS — K602 Anal fissure, unspecified: Secondary | ICD-10-CM | POA: Insufficient documentation

## 2022-01-22 DIAGNOSIS — R1013 Epigastric pain: Secondary | ICD-10-CM | POA: Insufficient documentation

## 2022-01-22 DIAGNOSIS — G40309 Generalized idiopathic epilepsy and epileptic syndromes, not intractable, without status epilepticus: Secondary | ICD-10-CM

## 2022-01-22 DIAGNOSIS — K625 Hemorrhage of anus and rectum: Secondary | ICD-10-CM | POA: Insufficient documentation

## 2022-01-22 DIAGNOSIS — R141 Gas pain: Secondary | ICD-10-CM | POA: Insufficient documentation

## 2022-01-22 DIAGNOSIS — K5900 Constipation, unspecified: Secondary | ICD-10-CM | POA: Insufficient documentation

## 2022-01-22 MED ORDER — CARBAMAZEPINE 100 MG PO CHEW: 100.0000 mg | CHEWABLE_TABLET | Freq: Two times a day (BID) | ORAL | 0 refills | Status: DC

## 2022-01-22 NOTE — Progress Notes (Signed)
New Patient Office Visit  Subjective    Patient ID: GIAH FICKETT, female    DOB: 08/14/1980  Age: 41 y.o. MRN: [id number]  CC:  Chief Complaint  Patient presents with   Follow-up    HFU    HPI ANIESA BOBACK presents to establish care. Patient reports that she had several seizures beginning in her sleep on labor day. She was transported to and evaluated in the ED and put on carbamazepine. She reports no further seizure activity since that day. She had been on carbamazepine started about 10 years ago for seizures but had since been taken off.    Outpatient Encounter Medications as of 01/22/2022  Medication Sig   [DISCONTINUED] carbamazepine (TEGRETOL) 100 MG chewable tablet Chew 1 tablet (100 mg total) by mouth in the morning and at bedtime.   carbamazepine (TEGRETOL) 100 MG chewable tablet Chew 1 tablet (100 mg total) by mouth in the morning and at bedtime.   estradiol (VIVELLE-DOT) 0.1 MG/24HR patch estradiol 0.1 mg/24 hr semiweekly transdermal patch  APPLY 1 PATCH(ES) TWICE A WEEK BY TRANSDERMAL ROUTE AS DIRECTED FOR 4 DAYS.   No facility-administered encounter medications on file as of 01/22/2022.    Past Medical History:  Diagnosis Date   Arthritis    in spine   Cesarean delivery delivered    Epilepsy [facility])    Fibrocystic breast disease    Menstrual migraine 08/21/2020   Seizures ([place])    started 2014 the only seizure, stopped meds in 2018    Past Surgical History:  Procedure Laterality Date   ANTERIOR CERVICAL DECOMP/DISCECTOMY FUSION N/A 11/23/2018   Procedure: cervical 5-6, cervical 6-7 ANTERIOR CERVICAL DECOMPRESSION/DISCECTOMY FUSION, ALLOGRAFT, PLATE;  Surgeon: Marybelle Killings, MD;  Location: [facility];  Service: Orthopedics;  Laterality: N/A;   CERVICAL ABLATION  2015   CESAREAN SECTION     DILATION AND CURETTAGE OF UTERUS     DILATION AND EVACUATION  08/16/2011   Procedure: DILATATION AND EVACUATION;  Surgeon: Alwyn Pea, MD;  Location: [facility];  Service:  Gynecology;  Laterality: N/A;   HYSTEROSCOPY  2004   LAPAROSCOPY  2004    Family History  Problem Relation Age of Onset   Heart attack Father    Hypertension Maternal Grandmother     Social History   Socioeconomic History   Marital status: Married    Spouse name: Not on file   Number of children: Not on file   Years of education: Not on file   Highest education level: Not on file  Occupational History   Not on file  Tobacco Use   Smoking status: Never   Smokeless tobacco: Never  Substance and Sexual Activity   Alcohol use: No   Drug use: No   Sexual activity: Yes    Partners: Male  Other Topics Concern   Not on file  Social History Narrative   Patient is married and lives with her husband.    Patient has 2 children.    Social Determinants of Health   Financial Resource Strain: Not on file  Food Insecurity: Not on file  Transportation Needs: Not on file  Physical Activity: Not on file  Stress: Not on file  Social Connections: Not on file  Intimate Partner Violence: Not on file    Review of Systems  Neurological:  Positive for dizziness and seizures. Negative for weakness and headaches.  All other systems reviewed and are negative.       Objective  BP 111/74   Pulse 87   Temp 98.1 F (36.7 C) (Oral)   Resp 16   Ht '5\' 10"'$  (1.778 m)   Wt 199 lb (90.3 kg)   SpO2 98%   BMI 28.55 kg/m   Physical Exam Vitals and nursing note reviewed.  Constitutional:      General: She is not in acute distress. HENT:     Head: Normocephalic and atraumatic.  Cardiovascular:     Rate and Rhythm: Normal rate and regular rhythm.  Pulmonary:     Effort: Pulmonary effort is normal.     Breath sounds: Normal breath sounds.  Abdominal:     Palpations: Abdomen is soft.     Tenderness: There is no abdominal tenderness.  Neurological:     General: No focal deficit present.     Mental Status: She is alert and oriented to person, place, and time.          Assessment & Plan:   1. Nonintractable generalized idiopathic epilepsy without status epilepticus ([place]) Continue carbamazepine. Meds refilled. Management per consultant.  2. Encounter to establish care     Return for prn, physical.   Becky Sax, MD

## 2022-03-01 ENCOUNTER — Other Ambulatory Visit: Payer: Self-pay | Admitting: Family Medicine

## 2022-03-05 ENCOUNTER — Encounter: Payer: BC Managed Care – PPO | Admitting: Family Medicine

## 2022-03-05 ENCOUNTER — Ambulatory Visit: Payer: BC Managed Care – PPO | Admitting: Family Medicine

## 2022-03-19 ENCOUNTER — Encounter: Payer: BC Managed Care – PPO | Admitting: Family Medicine

## 2022-04-09 ENCOUNTER — Emergency Department (HOSPITAL_BASED_OUTPATIENT_CLINIC_OR_DEPARTMENT_OTHER): Payer: BC Managed Care – PPO | Admitting: Radiology

## 2022-04-09 ENCOUNTER — Other Ambulatory Visit: Payer: Self-pay

## 2022-04-09 ENCOUNTER — Emergency Department (HOSPITAL_BASED_OUTPATIENT_CLINIC_OR_DEPARTMENT_OTHER)
Admission: EM | Admit: 2022-04-09 | Discharge: 2022-04-09 | Disposition: A | Discharge status: Home / Self Care | Payer: BC Managed Care – PPO | Attending: Emergency Medicine | Admitting: Emergency Medicine

## 2022-04-09 DIAGNOSIS — Z20822 Contact with and (suspected) exposure to covid-19: Secondary | ICD-10-CM | POA: Insufficient documentation

## 2022-04-09 DIAGNOSIS — J069 Acute upper respiratory infection, unspecified: Secondary | ICD-10-CM | POA: Insufficient documentation

## 2022-04-09 DIAGNOSIS — R0789 Other chest pain: Secondary | ICD-10-CM | POA: Insufficient documentation

## 2022-04-09 DIAGNOSIS — R059 Cough, unspecified: Secondary | ICD-10-CM | POA: Diagnosis present

## 2022-04-09 LAB — BASIC METABOLIC PANEL
Anion gap: 9 (ref 5–15)
BUN: 12 mg/dL (ref 6–20)
CO2: 23 mmol/L (ref 22–32)
Calcium: 9.4 mg/dL (ref 8.9–10.3)
Chloride: 104 mmol/L (ref 98–111)
Creatinine, Ser: 0.55 mg/dL (ref 0.44–1.00)
GFR, Estimated: >60 mL/min (ref >60)
Glucose, Bld: 140 mg/dL — ABNORMAL HIGH (ref 70–99)
Potassium: 4 mmol/L (ref 3.5–5.1)
Sodium: 136 mmol/L (ref 135–145)

## 2022-04-09 LAB — CBC
HCT: 37.9 % (ref 36.0–46.0)
Hemoglobin: 12.7 g/dL (ref 12.0–15.0)
MCH: 28 pg (ref 26.0–34.0)
MCHC: 33.5 g/dL (ref 30.0–36.0)
MCV: 83.7 fL (ref 80.0–100.0)
Platelets: 335 10*3/uL (ref 150–400)
RBC: 4.53 MIL/uL (ref 3.87–5.11)
RDW: 13.1 % (ref 11.5–15.5)
WBC: 5 10*3/uL (ref 4.0–10.5)
nRBC: 0 % (ref 0.0–0.2)

## 2022-04-09 LAB — TROPONIN I (HIGH SENSITIVITY)
Troponin I (High Sensitivity): <2 ng/L (ref <18)
Troponin I (High Sensitivity): <2 ng/L (ref <18)

## 2022-04-09 LAB — RESP PANEL BY RT-PCR (FLU A&B, COVID) ARPGX2
Influenza A by PCR: NEGATIVE
Influenza B by PCR: NEGATIVE
SARS Coronavirus 2 by RT PCR: NEGATIVE

## 2022-04-09 LAB — PREGNANCY, URINE: Preg Test, Ur: NEGATIVE

## 2022-04-09 MED ORDER — BENZONATATE 100 MG PO CAPS: 100.0000 mg | ORAL_CAPSULE | Freq: Three times a day (TID) | ORAL | 0 refills | Status: DC

## 2022-04-09 MED ORDER — IBUPROFEN 600 MG PO TABS: 600.0000 mg | ORAL_TABLET | Freq: Four times a day (QID) | ORAL | 0 refills | Status: AC | PRN

## 2022-04-09 MED ORDER — IBUPROFEN 800 MG PO TABS
800.0000 mg | ORAL_TABLET | Freq: Once | ORAL | Status: AC
  Administered 2022-04-09: 800 mg via ORAL
  Filled 2022-04-09: qty 1

## 2022-04-09 NOTE — ED Triage Notes (Signed)
Patient arrives ambulatory to triage with complaints of worsening body aches, respiratory symptoms, and chest pain. Patient reports respiratory symptoms x3 days with no relief from OTC meds.

## 2022-04-09 NOTE — Discharge Instructions (Addendum)
Note the work-up today was overall reassuring.  Symptoms are most likely due to viral illness although you tested negative for COVID, influenza.  Recommend symptomatic therapy at home with Motrin as needed for body aches/pains, addition of daily antihistamine in the form of Zyrtec/Allegra as well as cough suppressant to take as needed for excessive coughing.  Recommend reevaluation by your primary care provider in 3 to 5 days.  Please do not hesitate to return to emergency department for worrisome signs and symptoms we discussed become apparent.

## 2022-04-09 NOTE — ED Provider Notes (Signed)
[facility] EMERGENCY DEPT Provider Note   CSN: [id number] Arrival date & time: 04/09/22  1331     History  Chief Complaint  Patient presents with   Chest Pain   Cough    Paige Dunn is a 41 y.o. female.   Chest Pain Associated symptoms: cough   Cough Associated symptoms: chest pain     41 year old female presents emergency department with complaints of cough, congestion, ear pressure, diffuse myalgias, chest discomfort.  Patient reports that symptoms been present for the past 3 to 4 days.  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  She reports family numbers at home with similar symptoms 2 weeks ago.  Chest discomfort described as pressure located midsternal in nature of which is worsened with coughing episodes as well as any direct pressure over the area.  She reports some mild feelings of shortness of breath.  She denies any known fever, chills, abdominal pain, nausea, vomiting, urinary/vaginal symptoms, change in bowel habits.  She is taken over-the-counter cough and cold medicine of which has not helped her symptoms very much.  She is taken Tylenol for myalgias which is helped minimally.  She presents emergency department for further evaluation.  Denies history of DVT/PE, currently on therapy, recent surgery/immobilization, known malignancy.  Past medical history significant for epilepsy, arthritis  Home Medications Prior to Admission medications   Medication Sig Start Date End Date Taking? Authorizing Provider  benzonatate (TESSALON) 100 MG capsule Take 1 capsule (100 mg total) by mouth every 8 (eight) hours. 04/09/22  Yes Sherian Maroon A, PA  ibuprofen (ADVIL) 600 MG tablet Take 1 tablet (600 mg total) by mouth every 6 (six) hours as needed. 04/09/22  Yes Sherian Maroon A, PA  carbamazepine (TEGRETOL) 100 MG chewable tablet CHEW 1 TABLET (100 MG TOTAL) BY  MOUTH IN THE MORNING AND AT BEDTIME. 03/05/22 04/04/22  Georganna Skeans, MD  estradiol (VIVELLE-DOT) 0.1 MG/24HR patch estradiol 0.1 mg/24 hr semiweekly transdermal patch  APPLY 1 PATCH(ES) TWICE A WEEK BY TRANSDERMAL ROUTE AS DIRECTED FOR 4 DAYS.    [provider]      Allergies    Aspirin and Lamictal [lamotrigine]    Review of Systems   Review of Systems  Respiratory:  Positive for cough.   Cardiovascular:  Positive for chest pain.  All other systems reviewed and are negative.   Physical Exam Updated Vital Signs BP 108/71 (BP Location: Right Arm)   Pulse 74   Temp 98.9 F (37.2 C) (Oral)   Resp 17   Ht 5\' 10"  (1.778 m)   Wt 92.5 kg   SpO2 99%   BMI 29.27 kg/m  Physical Exam Vitals and nursing note reviewed.  Constitutional:      General: She is not in acute distress.    Appearance: She is well-developed.  HENT:     Head: Normocephalic and atraumatic.  Eyes:     Conjunctiva/sclera: Conjunctivae normal.  Neck:     Vascular: No JVD.  Cardiovascular:     Rate and Rhythm: Normal rate and regular rhythm.     Heart sounds: No murmur heard.    No friction rub. No gallop.  Pulmonary:     Effort: Pulmonary effort is normal. No respiratory distress.     Breath sounds: Normal breath sounds. No wheezing, rhonchi or rales.  Abdominal:     Palpations: Abdomen is soft.  Tenderness: There is no abdominal tenderness.  Musculoskeletal:        General: No swelling.     Cervical back: Neck supple.     Right lower leg: No edema.     Left lower leg: No edema.  Skin:    General: Skin is warm and dry.     Capillary Refill: Capillary refill takes less than 2 seconds.  Neurological:     Mental Status: She is alert.  Psychiatric:        Mood and Affect: Mood normal.     ED Results / Procedures / Treatments   Labs (all labs ordered are listed, but only abnormal results are displayed) Labs Reviewed  BASIC METABOLIC PANEL - Abnormal; Notable for the following  components:      Result Value   Glucose, Bld 140 (*)    All other components within normal limits  RESP PANEL BY RT-PCR (FLU A&B, COVID) ARPGX2  CBC  PREGNANCY, URINE  TROPONIN I (HIGH SENSITIVITY)  TROPONIN I (HIGH SENSITIVITY)    EKG None  Radiology DG Chest 2 View  Result Date: 04/09/2022 CLINICAL DATA:  A 42 year old female presents for evaluation of chest pain. Chest pain. EXAM: CHEST - 2 VIEW COMPARISON:  May 27, 2021 FINDINGS: Trachea midline. Cardiomediastinal contours and hilar structures are normal. Lungs are clear. No pneumothorax. No pleural effusion. On limited assessment no acute skeletal process. IMPRESSION: No active cardiopulmonary disease. Electronically Signed   By: Donzetta Kohut M.D.   On: 04/09/2022 14:51    Procedures Procedures    Medications Ordered in ED Medications  ibuprofen (ADVIL) tablet 800 mg (800 mg Oral Given 04/09/22 1623)    ED Course/ Medical Decision Making/ A&P                           Medical Decision Making Amount and/or Complexity of Data Reviewed Labs: ordered. Radiology: ordered.  Risk Prescription drug management.   This patient presents to the ED for concern of flulike illness, this involves an extensive number of treatment options, and is a complaint that carries with it a high risk of complications and morbidity.  The differential diagnosis includes influenza, COVID, viral URI, pneumonia, meningitis, sepsis, ACS, PE, pneumothorax, tamponade, pericarditis/myocarditis,   Co morbidities that complicate the patient evaluation  See HPI   Additional history obtained:  Additional history obtained from EMR External records from outside source obtained and reviewed including hospital records   Lab Tests:  I Ordered, and personally interpreted labs.  The pertinent results include: No leukocytosis noted.  No evidence anemia.  Platelets normal range.  No electrolyte abnormality noted.  Renal function within normal limits.   Respiratory viral panel negative.  Urine pregnancy negative.  Troponin of less than 2 with repeat less than 2; EKG sinus rhythm with no acute changes concerning for ischemia; doubt ACS.   Imaging Studies ordered:  I ordered imaging studies including chest x-ray I independently visualized and interpreted imaging which showed no active cardiopulmonary disease I agree with the radiologist interpretation   Cardiac Monitoring: / EKG:  The patient was maintained on a cardiac monitor.  I personally viewed and interpreted the cardiac monitored which showed an underlying rhythm of: Sinus rhythm with no acute changes indicative of ischemia   Consultations Obtained:  N/a   Problem List / ED Course / Critical interventions / Medication management  Viral URI I ordered medication including Advil for diffuse myalgias   Reevaluation of  the patient after these medicines showed that the patient improved I have reviewed the patients home medicines and have made adjustments as needed   Social Determinants of Health:  Denies tobacco, licit drug use   Test / Admission - Considered:  Viral URI Vitals signs within normal range and stable throughout visit. Laboratory/imaging studies significant for: See above Patient symptoms is likely secondary to viral illness.  No evidence of meningitis.  No auscultatory friction rub concerning for pericarditis.  Doubt the negative troponin with no acute findings on EKG indicative of ischemia so doubt ACS.  Patient PERC negative so doubt pulmonary embolism.  Chest pain seems secondary to excessive coughing as well as close related to diffuse myalgias per patient's description so we will treat with nonsteroidals.  Patient recommended symptomatic therapy at home with nonsteroidals/Tylenol for myalgias, antihistamine, cough suppressant to use as needed.  Follow-up with primary care recommended in 3 to 5 days.  Treatment plan discussed with patient she knowledge  understand was agreeable to said plan. Worrisome signs and symptoms were discussed with the patient, and the patient acknowledged understanding to return to the ED if noticed. Patient was stable upon discharge.          Final Clinical Impression(s) / ED Diagnoses Final diagnoses:  Viral URI with cough    Rx / DC Orders ED Discharge Orders          Ordered    ibuprofen (ADVIL) 600 MG tablet  Every 6 hours PRN        04/09/22 1650    benzonatate (TESSALON) 100 MG capsule  Every 8 hours        04/09/22 1650              Peter Garter, [place] 04/09/22 1726    Mardene Sayer, MD 04/10/22 1224

## 2022-04-16 ENCOUNTER — Other Ambulatory Visit: Payer: Self-pay | Admitting: Family Medicine

## 2022-04-23 ENCOUNTER — Encounter: Payer: BC Managed Care – PPO | Admitting: Family Medicine

## 2022-05-02 ENCOUNTER — Other Ambulatory Visit: Payer: Self-pay | Admitting: Obstetrics and Gynecology

## 2022-05-02 DIAGNOSIS — R928 Other abnormal and inconclusive findings on diagnostic imaging of breast: Secondary | ICD-10-CM

## 2022-05-14 DIAGNOSIS — N6019 Diffuse cystic mastopathy of unspecified breast: Secondary | ICD-10-CM | POA: Insufficient documentation

## 2022-05-27 ENCOUNTER — Ambulatory Visit
Admission: RE | Admit: 2022-05-27 | Discharge: 2022-05-27 | Disposition: A | Discharge status: Home / Self Care | Payer: BC Managed Care – PPO | Source: Ambulatory Visit | Attending: Obstetrics and Gynecology | Admitting: Obstetrics and Gynecology

## 2022-05-27 DIAGNOSIS — R928 Other abnormal and inconclusive findings on diagnostic imaging of breast: Secondary | ICD-10-CM

## 2022-06-04 ENCOUNTER — Ambulatory Visit
Admission: RE | Admit: 2022-06-04 | Discharge: 2022-06-04 | Disposition: A | Discharge status: Home / Self Care | Payer: BC Managed Care – PPO | Source: Ambulatory Visit | Attending: Obstetrics and Gynecology | Admitting: Obstetrics and Gynecology

## 2022-06-04 ENCOUNTER — Other Ambulatory Visit: Payer: Self-pay | Admitting: Obstetrics and Gynecology

## 2022-06-04 ENCOUNTER — Ambulatory Visit (INDEPENDENT_AMBULATORY_CARE_PROVIDER_SITE_OTHER): Payer: BC Managed Care – PPO | Admitting: Family Medicine

## 2022-06-04 ENCOUNTER — Encounter: Payer: Self-pay | Admitting: Family Medicine

## 2022-06-04 VITALS — BP 105/73 | HR 78 | Resp 16 | Ht 70.0 in | Wt 201.8 lb

## 2022-06-04 DIAGNOSIS — R928 Other abnormal and inconclusive findings on diagnostic imaging of breast: Secondary | ICD-10-CM

## 2022-06-04 DIAGNOSIS — Z0000 Encounter for general adult medical examination without abnormal findings: Secondary | ICD-10-CM | POA: Diagnosis not present

## 2022-06-04 DIAGNOSIS — Z13220 Encounter for screening for lipoid disorders: Secondary | ICD-10-CM | POA: Diagnosis not present

## 2022-06-04 DIAGNOSIS — N6314 Unspecified lump in the right breast, lower inner quadrant: Secondary | ICD-10-CM

## 2022-06-04 DIAGNOSIS — Z130 Encounter for screening for diseases of the blood and blood-forming organs and certain disorders involving the immune mechanism: Secondary | ICD-10-CM

## 2022-06-04 HISTORY — PX: BREAST BIOPSY: SHX20

## 2022-06-04 NOTE — Progress Notes (Unsigned)
Patient is herr for her CPE Patient has no other concerns today for providerr

## 2022-06-05 ENCOUNTER — Encounter: Payer: Self-pay | Admitting: Family Medicine

## 2022-06-05 LAB — LIPID PANEL
Chol/HDL Ratio: 5.6 ratio — ABNORMAL HIGH (ref 0.0–4.4)
Cholesterol, Total: 211 mg/dL — ABNORMAL HIGH (ref 100–199)
HDL: 38 mg/dL — ABNORMAL LOW (ref >39)
LDL Chol Calc (NIH): 138 mg/dL — ABNORMAL HIGH (ref 0–99)
Triglycerides: 193 mg/dL — ABNORMAL HIGH (ref 0–149)
VLDL Cholesterol Cal: 35 mg/dL (ref 5–40)

## 2022-06-05 LAB — CMP14+EGFR
ALT: 17 IU/L (ref 0–32)
AST: 20 IU/L (ref 0–40)
Albumin/Globulin Ratio: 1.6 (ref 1.2–2.2)
Albumin: 4.5 g/dL (ref 3.9–4.9)
Alkaline Phosphatase: 95 IU/L (ref 44–121)
BUN/Creatinine Ratio: 18 (ref 9–23)
BUN: 11 mg/dL (ref 6–24)
Bilirubin Total: <0.2 mg/dL (ref 0.0–1.2)
CO2: 21 mmol/L (ref 20–29)
Calcium: 9.7 mg/dL (ref 8.7–10.2)
Chloride: 100 mmol/L (ref 96–106)
Creatinine, Ser: 0.6 mg/dL (ref 0.57–1.00)
Globulin, Total: 2.9 g/dL (ref 1.5–4.5)
Glucose: 74 mg/dL (ref 70–99)
Potassium: 4.5 mmol/L (ref 3.5–5.2)
Sodium: 138 mmol/L (ref 134–144)
Total Protein: 7.4 g/dL (ref 6.0–8.5)
eGFR: 114 mL/min/{1.73_m2} (ref >59)

## 2022-06-05 LAB — CBC WITH DIFFERENTIAL/PLATELET
Basophils Absolute: 0 10*3/uL (ref 0.0–0.2)
Basos: 1 %
EOS (ABSOLUTE): 0 10*3/uL (ref 0.0–0.4)
Eos: 1 %
Hematocrit: 37.3 % (ref 34.0–46.6)
Hemoglobin: 12.7 g/dL (ref 11.1–15.9)
Immature Grans (Abs): 0 10*3/uL (ref 0.0–0.1)
Immature Granulocytes: 0 %
Lymphocytes Absolute: 1.8 10*3/uL (ref 0.7–3.1)
Lymphs: 34 %
MCH: 28.7 pg (ref 26.6–33.0)
MCHC: 34 g/dL (ref 31.5–35.7)
MCV: 84 fL (ref 79–97)
Monocytes Absolute: 0.6 10*3/uL (ref 0.1–0.9)
Monocytes: 11 %
Neutrophils Absolute: 2.8 10*3/uL (ref 1.4–7.0)
Neutrophils: 53 %
Platelets: 374 10*3/uL (ref 150–450)
RBC: 4.42 x10E6/uL (ref 3.77–5.28)
RDW: 13.1 % (ref 11.7–15.4)
WBC: 5.3 10*3/uL (ref 3.4–10.8)

## 2022-06-05 NOTE — Progress Notes (Signed)
Established Patient Office Visit  Subjective    Patient ID: Paige Dunn, female    DOB: 1980-12-08  Age: 42 y.o. MRN: [id number]  CC:  Chief Complaint  Patient presents with   Annual Exam    HPI ZURIE PLATAS presents for routine annual exam. Patient denies acute complaints or concerns.    Outpatient Encounter Medications as of 06/04/2022  Medication Sig   carbamazepine (CARBATROL) 300 MG 12 hr capsule Take 300 mg by mouth 2 (two) times daily.   [DISCONTINUED] carbamazepine (TEGRETOL) 100 MG chewable tablet CHEW 1 TABLET (100 MG TOTAL) BY MOUTH IN THE MORNING AND AT BEDTIME.   estradiol (VIVELLE-DOT) 0.1 MG/24HR patch estradiol 0.1 mg/24 hr semiweekly transdermal patch  APPLY 1 PATCH(ES) TWICE A WEEK BY TRANSDERMAL ROUTE AS DIRECTED FOR 4 DAYS. (Patient not taking: Reported on 06/04/2022)   ibuprofen (ADVIL) 600 MG tablet Take 1 tablet (600 mg total) by mouth every 6 (six) hours as needed. (Patient not taking: Reported on 06/04/2022)   [DISCONTINUED] benzonatate (TESSALON) 100 MG capsule Take 1 capsule (100 mg total) by mouth every 8 (eight) hours. (Patient not taking: Reported on 06/04/2022)   No facility-administered encounter medications on file as of 06/04/2022.    Past Medical History:  Diagnosis Date   Arthritis    in spine   Cesarean delivery delivered    Epilepsy [facility])    Fibrocystic breast disease    Menstrual migraine 08/21/2020   Seizures ([place])    started 2014 the only seizure, stopped meds in 2018    Past Surgical History:  Procedure Laterality Date   ANTERIOR CERVICAL DECOMP/DISCECTOMY FUSION N/A 11/23/2018   Procedure: cervical 5-6, cervical 6-7 ANTERIOR CERVICAL DECOMPRESSION/DISCECTOMY FUSION, ALLOGRAFT, PLATE;  Surgeon: Marybelle Killings, MD;  Location: [facility];  Service: Orthopedics;  Laterality: N/A;   BREAST BIOPSY Right 06/04/2022   [place] RT BREAST BX W LOC DEV 1ST LESION IMG BX SPEC US GUIDE 06/04/2022 [facility]   CERVICAL ABLATION  2015   CESAREAN  SECTION     DILATION AND CURETTAGE OF UTERUS     DILATION AND EVACUATION  08/16/2011   Procedure: DILATATION AND EVACUATION;  Surgeon: Alwyn Pea, MD;  Location: [facility];  Service: Gynecology;  Laterality: N/A;   HYSTEROSCOPY  2004   LAPAROSCOPY  2004    Family History  Problem Relation Age of Onset   Heart attack Father    Hypertension Maternal Grandmother     Social History   Socioeconomic History   Marital status: Married    Spouse name: Not on file   Number of children: Not on file   Years of education: Not on file   Highest education level: Not on file  Occupational History   Not on file  Tobacco Use   Smoking status: Never   Smokeless tobacco: Never  Substance and Sexual Activity   Alcohol use: No   Drug use: No   Sexual activity: Yes    Partners: Male  Other Topics Concern   Not on file  Social History Narrative   Patient is married and lives with her husband.    Patient has 2 children.    Social Determinants of Health   Financial Resource Strain: Not on file  Food Insecurity: Not on file  Transportation Needs: Not on file  Physical Activity: Not on file  Stress: Not on file  Social Connections: Not on file  Intimate Partner Violence: Not on file    Review of Systems  All other systems reviewed and are negative.       Objective    BP 105/73   Pulse 78   Resp 16   Ht '5\' 10"'$  (1.778 m)   Wt 201 lb 12.8 oz (91.5 kg)   SpO2 96%   BMI 28.96 kg/m   Physical Exam Vitals and nursing note reviewed.  Constitutional:      General: She is not in acute distress. HENT:     Head: Normocephalic and atraumatic.     Right Ear: Tympanic membrane, ear canal and external ear normal.     Left Ear: Tympanic membrane, ear canal and external ear normal.     Nose: Nose normal.     Mouth/Throat:     Mouth: Mucous membranes are moist.     Pharynx: Oropharynx is clear.  Eyes:     Conjunctiva/sclera: Conjunctivae normal.     Pupils: Pupils are equal, round,  and reactive to light.  Neck:     Thyroid: No thyromegaly.  Cardiovascular:     Rate and Rhythm: Normal rate and regular rhythm.     Heart sounds: Normal heart sounds. No murmur heard. Pulmonary:     Effort: Pulmonary effort is normal. No respiratory distress.     Breath sounds: Normal breath sounds.  Abdominal:     General: There is no distension.     Palpations: Abdomen is soft. There is no mass.     Tenderness: There is no abdominal tenderness.  Musculoskeletal:        General: Normal range of motion.     Cervical back: Normal range of motion and neck supple.  Skin:    General: Skin is warm and dry.  Neurological:     General: No focal deficit present.     Mental Status: She is alert and oriented to person, place, and time.  Psychiatric:        Mood and Affect: Mood normal.        Behavior: Behavior normal.         Assessment & Plan:   1. Annual physical exam  - CMP14+EGFR  2. Screening for lipid disorders  - Lipid Panel  3. Screening for deficiency anemia  - CBC with Differential    Return in about 1 year (around 06/05/2023) for physical.   Becky Sax, MD

## 2022-06-08 ENCOUNTER — Encounter: Payer: Self-pay | Admitting: Family Medicine

## 2022-06-14 ENCOUNTER — Other Ambulatory Visit: Payer: Self-pay | Admitting: Family Medicine

## 2022-06-14 DIAGNOSIS — H9190 Unspecified hearing loss, unspecified ear: Secondary | ICD-10-CM

## 2024-03-23 ENCOUNTER — Emergency Department (HOSPITAL_COMMUNITY)
Admission: EM | Admit: 2024-03-23 | Discharge: 2024-03-24 | Disposition: A | Discharge status: Home / Self Care | Attending: Emergency Medicine | Admitting: Emergency Medicine

## 2024-03-23 ENCOUNTER — Emergency Department (HOSPITAL_COMMUNITY)

## 2024-03-23 ENCOUNTER — Encounter (HOSPITAL_COMMUNITY): Payer: Self-pay | Admitting: Emergency Medicine

## 2024-03-23 ENCOUNTER — Other Ambulatory Visit: Payer: Self-pay

## 2024-03-23 DIAGNOSIS — R079 Chest pain, unspecified: Secondary | ICD-10-CM | POA: Insufficient documentation

## 2024-03-23 LAB — BASIC METABOLIC PANEL WITH GFR
Anion gap: 18 — ABNORMAL HIGH (ref 5–15)
BUN: 11 mg/dL (ref 6–20)
CO2: 19 mmol/L — ABNORMAL LOW (ref 22–32)
Calcium: 9.2 mg/dL (ref 8.9–10.3)
Chloride: 99 mmol/L (ref 98–111)
Creatinine, Ser: 0.53 mg/dL (ref 0.44–1.00)
GFR, Estimated: >60 mL/min (ref >60)
Glucose, Bld: 133 mg/dL — ABNORMAL HIGH (ref 70–99)
Potassium: 4.2 mmol/L (ref 3.5–5.1)
Sodium: 136 mmol/L (ref 135–145)

## 2024-03-23 LAB — CBC
HCT: 39 % (ref 36.0–46.0)
Hemoglobin: 12.8 g/dL (ref 12.0–15.0)
MCH: 28.3 pg (ref 26.0–34.0)
MCHC: 32.8 g/dL (ref 30.0–36.0)
MCV: 86.3 fL (ref 80.0–100.0)
Platelets: 381 K/uL (ref 150–400)
RBC: 4.52 MIL/uL (ref 3.87–5.11)
RDW: 12.5 % (ref 11.5–15.5)
WBC: 6.4 K/uL (ref 4.0–10.5)
nRBC: 0 % (ref 0.0–0.2)

## 2024-03-23 LAB — TROPONIN I (HIGH SENSITIVITY): Troponin I (High Sensitivity): <2 ng/L (ref <18)

## 2024-03-23 LAB — HCG, SERUM, QUALITATIVE: Preg, Serum: NEGATIVE

## 2024-03-23 LAB — D-DIMER, QUANTITATIVE: D-Dimer, Quant: 0.29 ug{FEU}/mL (ref 0.00–0.50)

## 2024-03-23 NOTE — ED Provider Triage Note (Signed)
 Emergency Medicine Provider Triage Evaluation Note  Paige Dunn , a 43 y.o. female  was evaluated in triage.  Pt complains of right sided upper cp, pleuritic in nature, constant. Recent travel and left lower ext pain/swelling. Concern for dvt/pe so dimer added. No DVT US  at this time.   Review of Systems  Positive: CP Negative: fever  Physical Exam  BP 128/85 (BP Location: Left Arm)   Pulse 80   Temp 98.5 F (36.9 C)   Resp 18   Ht 5' 10 (1.778 m)   Wt 93 kg   LMP 03/16/2024 (Within Days)   SpO2 100%   BMI 29.41 kg/m  Gen:   Awake, no distress   Resp:  Normal effort  MSK:   Moves extremities without difficulty  Other:  Left calf, mild TTP, Neurvasc intact  Medical Decision Making  Medically screening exam initiated at 9:30 PM.  Appropriate orders placed.  Paige Dunn was informed that the remainder of the evaluation will be completed by another provider, this initial triage assessment does not replace that evaluation, and the importance of remaining in the ED until their evaluation is complete.     Shermon Warren SAILOR, PA-C 03/23/24 2131

## 2024-03-23 NOTE — ED Notes (Signed)
 Patient transported to X-ray

## 2024-03-23 NOTE — ED Triage Notes (Signed)
 BIB [facility] from urgent care for rt sided chest pain that started at approx 1300, feels like a pressure and is radiating down her left arm. Was intermittent now is constant 5/10 pain. C/o left calf pain 3/10 pain  BP 150/90 HR 70 Spo2 96 RA 22 g rt hand  Urgent care gave 162 mg of aspirin with allergy reported.

## 2024-03-24 LAB — TROPONIN I (HIGH SENSITIVITY): Troponin I (High Sensitivity): 2 ng/L (ref <18)

## 2024-03-24 MED ORDER — CARBAMAZEPINE ER 200 MG PO TB12
300.0000 mg | ORAL_TABLET | Freq: Once | ORAL | Status: AC
  Administered 2024-03-24: 300 mg via ORAL
  Filled 2024-03-24: qty 1

## 2024-03-24 NOTE — ED Provider Notes (Signed)
 [place] EMERGENCY DEPARTMENT AT [facility] Provider Note   CSN: [id number] Arrival date & time: 03/23/24  2029     Patient presents with: Chest Pain   Paige Dunn is a 43 y.o. female.  Patient with past medical history significant for seizure disorder, status post cervical spinal fusion, epigastric pain presents to the emergency department complaining of right-sided chest pain that began at 1 PM.  She reports initially a fluttering feeling followed by some right sided pressure and some right sided arm discomfort.  Patient also traveled recently to [place]  and is concerned about left calf pain with no associated swelling.  She had a brief period of shortness of with some lightheadedness which resolved.  She denies abdominal pain, nausea, vomiting.  She took no medication prior to arrival.    Chest Pain      Prior to Admission medications   Medication Sig Start Date End Date Taking? Authorizing Provider  carbamazepine  (CARBATROL ) 300 MG 12 hr capsule Take 300 mg by mouth 2 (two) times daily.    [provider]  estradiol (VIVELLE-DOT) 0.1 MG/24HR patch estradiol 0.1 mg/24 hr semiweekly transdermal patch  APPLY 1 PATCH(ES) TWICE A WEEK BY TRANSDERMAL ROUTE AS DIRECTED FOR 4 DAYS. Patient not taking: Reported on 06/04/2022    [provider]  ibuprofen  (ADVIL ) 600 MG tablet Take 1 tablet (600 mg total) by mouth every 6 (six) hours as needed. Patient not taking: Reported on 06/04/2022 04/09/22   Silver Wonda LABOR, PA    Allergies: Aspirin and Lamictal  [lamotrigine ]    Review of Systems  Cardiovascular:  Positive for chest pain.    Updated Vital Signs BP 123/62 (BP Location: Right Arm)   Pulse 74   Temp 97.9 F (36.6 C)   Resp 12   Ht 5' 10 (1.778 m)   Wt 93 kg   LMP 03/16/2024 (Within Days)   SpO2 96%   BMI 29.41 kg/m   Physical Exam Vitals and nursing note reviewed.  Constitutional:      General: She is not in acute distress.     Appearance: She is well-developed.  HENT:     Head: Normocephalic and atraumatic.  Eyes:     Conjunctiva/sclera: Conjunctivae normal.  Cardiovascular:     Rate and Rhythm: Normal rate and regular rhythm.     Heart sounds: No murmur heard. Pulmonary:     Effort: Pulmonary effort is normal. No respiratory distress.     Breath sounds: Normal breath sounds.  Chest:     Chest wall: No tenderness.  Abdominal:     Palpations: Abdomen is soft.     Tenderness: There is no abdominal tenderness.  Musculoskeletal:        General: No swelling.     Cervical back: Neck supple.     Right lower leg: No tenderness.     Left lower leg: No tenderness.     Comments: No swelling appreciated in the left lower leg  Skin:    General: Skin is warm and dry.     Capillary Refill: Capillary refill takes less than 2 seconds.  Neurological:     Mental Status: She is alert.  Psychiatric:        Mood and Affect: Mood normal.     (all labs ordered are listed, but only abnormal results are displayed) Labs Reviewed  BASIC METABOLIC PANEL WITH GFR - Abnormal; Notable for the following components:      Result Value   CO2  19 (*)    Glucose, Bld 133 (*)    Anion gap 18 (*)    All other components within normal limits  CBC  HCG, SERUM, QUALITATIVE  D-DIMER, QUANTITATIVE  TROPONIN I (HIGH SENSITIVITY)  TROPONIN I (HIGH SENSITIVITY)    EKG: EKG Interpretation Date/Time:  Tuesday March 23 2024 20:47:02 EST Ventricular Rate:  78 PR Interval:  168 QRS Duration:  86 QT Interval:  378 QTC Calculation: 430 R Axis:   57  Text Interpretation: Normal sinus rhythm Normal ECG When compared with ECG of 09-Apr-2022 14:19, No significant change was found Confirmed by Raford Lenis ([id number]) on 03/23/2024 11:17:50 PM  Radiology: [facility] Chest 2 View Result Date: 03/23/2024 CLINICAL DATA:  Chest pain. EXAM: DG CHEST 2V COMPARISON:  April 09, 2022 FINDINGS: The heart size and mediastinal contours are within normal  limits. Both lungs are clear. Postoperative changes are seen within the lower cervical spine. The visualized skeletal structures are unremarkable. IMPRESSION: No active cardiopulmonary disease. Electronically Signed   By: Suzen Dials M.D.   On: 03/23/2024 21:38     Procedures   Medications Ordered in the ED  carbamazepine  (TEGRETOL  XR) 12 hr tablet 300 mg (300 mg Oral Given 03/24/24 0302)                                    Medical Decision Making  This patient presents to the ED for concern of chest pain, this involves an extensive number of treatment options, and is a complaint that carries with it a high risk of complications and morbidity.  The differential diagnosis includes musculoskeletal pain, ACS, pneumonia, PE, dissection, anxiety, others   Co morbidities / Chronic conditions that complicate the patient evaluation  As noted in HPI   Additional history obtained:  Additional history obtained from EMR External records from outside source obtained and reviewed including neurology notes   Lab Tests:  I Ordered, and personally interpreted labs.  The pertinent results include: Negative troponins x 2, negative D-dimer   Imaging Studies ordered:  I ordered imaging studies including chest x-ray I independently visualized and interpreted imaging which showed no acute findings I agree with the radiologist interpretation   Cardiac Monitoring: / EKG:  The patient was maintained on a cardiac monitor.  I personally viewed and interpreted the cardiac monitored which showed an underlying rhythm of: Sinus rhythm   Problem List / ED Course / Critical interventions / Medication management   I ordered medication including Tegretol  Reevaluation of the patient after these medicines showed that the patient improved I have reviewed the patients home medicines and have made adjustments as needed    Test / Admission - Considered:  Patient with reassuring workup with  negative troponins x 2 and a nonischemic EKG.  Not consistent with ACS.  D-dimer negative showing no clot burden.  No suspicion of PE/DVT at this time.  No left lower extremity swelling on examination.  No calf tenderness.  History not consistent with dissection.  Workup reassuring with no life-threatening condition identified at this time.  Plan to refer to cardiology for outpatient follow-up.  Patient appears stable for discharge home with no indication for admission.  Return precautions provided.      Final diagnoses:  Chest pain, unspecified type    ED Discharge Orders          Ordered    Ambulatory referral to Cardiology  Comments: If you have not heard from the Cardiology office within the next 72 hours please call [phone].   03/24/24 0324               Logan Ubaldo NOVAK, PA-C 03/24/24 0325    Raford Lenis, MD 03/24/24 [phone]

## 2024-03-24 NOTE — ED Notes (Addendum)
 Pt wanted to leave b/c she left her seizure medication at home.  RN spoke to provider who ordered medication. The medication will be sent from the pharmacy and then given to pt.  Pt updated.  Pharmacy called to request meds be sent to the charge RN so that they can be distributed.

## 2024-03-24 NOTE — Discharge Instructions (Signed)
 Your workup this evening was reassuring.  Please follow-up with your primary care provider for further evaluation.  A referral has been placed with cardiology.  They should contact you for an outpatient follow-up appointment.  If you develop any life-threatening symptoms please return to the emergency department.
